# Patient Record
Sex: Male | Born: 1950 | Race: Black or African American | Hispanic: No | Smoking: Current every day smoker
Health system: Southern US, Community
[De-identification: ages and names within clinical notes are randomized; demographics above are authoritative.]

## PROBLEM LIST (undated history)

## (undated) DIAGNOSIS — D649 Anemia, unspecified: Secondary | ICD-10-CM

## (undated) DIAGNOSIS — I10 Essential (primary) hypertension: Secondary | ICD-10-CM

## (undated) DIAGNOSIS — K219 Gastro-esophageal reflux disease without esophagitis: Secondary | ICD-10-CM

## (undated) DIAGNOSIS — K852 Alcohol induced acute pancreatitis without necrosis or infection: Secondary | ICD-10-CM

## (undated) DIAGNOSIS — E7800 Pure hypercholesterolemia, unspecified: Secondary | ICD-10-CM

## (undated) DIAGNOSIS — Z720 Tobacco use: Secondary | ICD-10-CM

## (undated) HISTORY — PX: DENTAL SURGERY: SHX609

## (undated) HISTORY — DX: Anemia, unspecified: D64.9

---

## 2011-08-24 ENCOUNTER — Ambulatory Visit: Payer: Self-pay

## 2011-08-24 DIAGNOSIS — I10 Essential (primary) hypertension: Secondary | ICD-10-CM

## 2011-08-24 DIAGNOSIS — F17200 Nicotine dependence, unspecified, uncomplicated: Secondary | ICD-10-CM

## 2012-01-25 ENCOUNTER — Telehealth: Payer: Self-pay

## 2012-01-25 NOTE — Telephone Encounter (Signed)
Patient last seen January 2012--needs OV.  Called patient and let him know--he will try to see [facility].

## 2012-01-25 NOTE — Telephone Encounter (Signed)
The patient called to obtain refill of his Norvasc 5 mg Rx.  The patient stated that the [facility] on [address] told him he needed to call his doctor.  Please call patient at [phone].

## 2012-04-19 ENCOUNTER — Ambulatory Visit: Payer: Self-pay | Admitting: Family Medicine

## 2012-04-19 VITALS — BP 130/82 | HR 86 | Temp 98.1°F | Resp 16 | Ht 68.0 in | Wt 149.0 lb

## 2012-04-19 DIAGNOSIS — E785 Hyperlipidemia, unspecified: Secondary | ICD-10-CM

## 2012-04-19 DIAGNOSIS — I10 Essential (primary) hypertension: Secondary | ICD-10-CM

## 2012-04-19 DIAGNOSIS — E782 Mixed hyperlipidemia: Secondary | ICD-10-CM

## 2012-04-19 MED ORDER — AMLODIPINE BESYLATE 5 MG PO TABS: 5.0000 mg | ORAL_TABLET | Freq: Every day | ORAL | Status: DC

## 2012-04-19 MED ORDER — LOVASTATIN 20 MG PO TABS: 20.0000 mg | ORAL_TABLET | Freq: Every day | ORAL | Status: DC

## 2012-04-19 NOTE — Progress Notes (Signed)
  Subjective:    Patient ID: Nicholas Blevins, male    DOB: 02-Jan-1951, 61 y.o.   MRN: [id number]  HPI Nicholas Blevins is a 61 y.o. male  Hx of HTN - prior on Norvasc 5mg  QD  Last taken 4 months ago.  Had been out of work - unable to come in.  Had job physical in [employer] - told pressure was high then - diastolic 97?  Not sure.  No recent outside blood sugars. No headaches/cp or other new symptoms.   Seen here Jan 2013 - BP 168/92.  6 months meds given.    Had hyperlipidemia - started on Lipitor in January for LDL 175 - ran out few months ago.     Review of Systems  Constitutional: Negative for fatigue and unexpected weight change.  Eyes: Negative for visual disturbance.  Respiratory: Negative for cough, chest tightness and shortness of breath.   Cardiovascular: Negative for chest pain, palpitations and leg swelling.  Gastrointestinal: Negative for abdominal pain and blood in stool.  Genitourinary: Negative for decreased urine volume and difficulty urinating.  Neurological: Negative for dizziness, light-headedness and headaches.       Objective:   Physical Exam  Vitals reviewed. Constitutional: He is oriented to person, place, and time. He appears well-developed and well-nourished.  HENT:  Head: Normocephalic and atraumatic.  Eyes: EOM are normal. Pupils are equal, round, and reactive to light.  Neck: No JVD present. Carotid bruit is not present.  Cardiovascular: Normal rate, regular rhythm and normal heart sounds.   No murmur heard. Pulmonary/Chest: Effort normal and breath sounds normal. He has no rales.  Musculoskeletal: He exhibits no edema.  Neurological: He is alert and oriented to person, place, and time.  Skin: Skin is warm and dry.  Psychiatric: He has a normal mood and affect.  recheck BP left arm - manual - 162/98.        Assessment & Plan:   Nicholas Blevins is a 61 y.o. male 1. HTN (hypertension)  Comprehensive metabolic panel, amLODipine (NORVASC) 5 MG tablet    2. Hyperlipidemia  Comprehensive metabolic panel, lovastatin (MEVACOR) 20 MG tablet   Restart norvasc at 5mg . .  Check CMP for statin restart - low dose generic to start, but will need lipid panel in few months. rtc precautions.  Patient Instructions  Keep a record of your blood pressures outside of the office and bring them to the next office visit. Restart your amlodipine for blood pressure, and lovastatin for cholesterol.  You need to recheck in the next 3 months for fasting cholesterol test before refills of the cholesterol medicine. If you would like to look into financial assistance options for your healthcare, you can call the [facility] Financial Aid Office at [phone]. Return to the clinic or go to the nearest emergency room if any of your symptoms worsen or new symptoms occur. Your should receive a call or letter about your lab results within the next week to 10 days.

## 2012-04-19 NOTE — Patient Instructions (Signed)
Keep a record of your blood pressures outside of the office and bring them to the next office visit. Restart your amlodipine for blood pressure, and lovastatin for cholesterol.  You need to recheck in the next 3 months for fasting cholesterol test before refills of the cholesterol medicine. If you would like to look into financial assistance options for your healthcare, you can call the [facility] Financial Aid Office at [phone]. Return to the clinic or go to the nearest emergency room if any of your symptoms worsen or new symptoms occur. Your should receive a call or letter about your lab results within the next week to 10 days.

## 2012-04-20 LAB — COMPREHENSIVE METABOLIC PANEL
AST: 35 U/L (ref 0–37)
Albumin: 4.7 g/dL (ref 3.5–5.2)
Alkaline Phosphatase: 72 U/L (ref 39–117)
Chloride: 101 mEq/L (ref 96–112)
Potassium: 4 mEq/L (ref 3.5–5.3)
Sodium: 138 mEq/L (ref 135–145)
Total Protein: 7.6 g/dL (ref 6.0–8.3)

## 2012-04-21 ENCOUNTER — Telehealth: Payer: Self-pay

## 2012-04-21 NOTE — Telephone Encounter (Signed)
Pt returned call about labs, please try again @ [phone]

## 2012-04-22 NOTE — Telephone Encounter (Signed)
Spoke with wife and gave her the results of his lab being normal and in range, to call us back if any questions. Will pass message to pt.

## 2012-04-22 NOTE — Telephone Encounter (Signed)
SENT TO CLERICAL IN ERROR

## 2012-12-19 ENCOUNTER — Ambulatory Visit (INDEPENDENT_AMBULATORY_CARE_PROVIDER_SITE_OTHER): Payer: Commercial Indemnity | Admitting: Family Medicine

## 2012-12-19 ENCOUNTER — Ambulatory Visit: Payer: Commercial Indemnity

## 2012-12-19 VITALS — BP 140/60 | HR 73 | Temp 98.2°F | Resp 16 | Ht 68.5 in | Wt 150.0 lb

## 2012-12-19 DIAGNOSIS — E7800 Pure hypercholesterolemia, unspecified: Secondary | ICD-10-CM | POA: Insufficient documentation

## 2012-12-19 DIAGNOSIS — M25562 Pain in left knee: Secondary | ICD-10-CM

## 2012-12-19 DIAGNOSIS — I10 Essential (primary) hypertension: Secondary | ICD-10-CM

## 2012-12-19 DIAGNOSIS — M25569 Pain in unspecified knee: Secondary | ICD-10-CM

## 2012-12-19 MED ORDER — TRAMADOL HCL 50 MG PO TABS: 50.0000 mg | ORAL_TABLET | Freq: Three times a day (TID) | ORAL | Status: DC | PRN

## 2012-12-19 MED ORDER — MELOXICAM 7.5 MG PO TABS: 7.5000 mg | ORAL_TABLET | Freq: Every day | ORAL | Status: DC

## 2012-12-19 NOTE — Progress Notes (Signed)
[facility] [address], [place] [address] [id number]  Date:  12/19/2012   Name:  Nicholas Blevins   DOB:  Jun 22, 1951   MRN:  [id number]  PCP:  [name], MD    Chief Complaint: Knee Injury   History of Present Illness:  Nicholas Blevins is a 62 y.o. very pleasant male patient who presents with the following:  [place] the [place] and he [place]- he caught his foot on a fence and fell down onto his left knee.  He did not note any particular pain when he fell, but this morning the knee seemed swollen and stiff, but not really painful.    He has been able to walk on the knee ok, but it does feel stiff.  It feels a little unstable when he walks downhill, but it has not given away.    No prior history of knee problems.    His last tetanus shot was less than 10 years ago- he feels certain of this fact.    He is otherwise generally healthy except for HTN and high cholesterol.    There are no active problems to display for this patient.   No past medical history on file.  No past surgical history on file.  History  Substance Use Topics  . Smoking status: Current Every Day Smoker  . Smokeless tobacco: Not on file  . Alcohol Use: Not on file    No family history on file.  No Known Allergies  Medication list has been reviewed and updated.  Current Outpatient Prescriptions on File Prior to Visit  Medication Sig Dispense Refill  . amLODipine (NORVASC) 5 MG tablet Take 1 tablet (5 mg total) by mouth daily.  90 tablet  1  . lovastatin (MEVACOR) 20 MG tablet Take 1 tablet (20 mg total) by mouth at bedtime.  90 tablet  0   No current facility-administered medications on file prior to visit.    Review of Systems:  As per HPI- otherwise negative.   Physical Examination: Filed Vitals:   12/19/12 1110  BP: 140/60  Pulse: 73  Temp: 98.2 F (36.8 C)  Resp: 16   Filed Vitals:   12/19/12 1110  Height: 5' 8.5" (1.74 m)   Weight: 150 lb (68.04 kg)   Body mass index is 22.47 kg/(m^2). Ideal Body Weight: Weight in (lb) to have BMI = 25: 166.5  GEN: WDWN, NAD, Non-toxic, A & O x 3, thin build HEENT: Atraumatic, Normocephalic. Neck supple. No masses, No LAD. Ears and Nose: No external deformity. CV: RRR, No M/G/R. No JVD. No thrill. No extra heart sounds. PULM: CTA B, no wheezes, crackles, rhonchi. No retractions. No resp. distress. No accessory muscle use. EXTR: No c/c/e NEURO Normal gait.  PSYCH: Normally interactive. Conversant. Not depressed or anxious appearing.  Calm demeanor.  Left knee:  Normal flexion and extension.  Negative varus and valgus stress.  No heat or redness in the knee.  He has slight laxity to anterior drawer but this is equal bilaterally.  Small joint effusion.    [facility] reading (PRIMARY) by  Dr. Patsy Lager. Left knee: negative  *RADIOLOGY REPORT*  Clinical Data: Left knee pain  LEFT KNEE - COMPLETE 4+ VIEW  Comparison: None.  Findings: Four views of the left knee submitted. No acute fracture or subluxation. No joint effusion.  IMPRESSION: No acute fracture or subluxation.  Assessment and Plan: Knee pain, acute, left - Plan: DG Knee  Complete 4 Views Left, meloxicam (MOBIC) 7.5 MG tablet, traMADol (ULTRAM) 50 MG tablet  Knee pain after a fall.  Suspect he may have a meniscal tear, but at this time he would like to see how he does over the next several days.  Given a hinged knee brace.  mobic and tramadol to use as needed.    He will call if not better soon  Signed Abbe Amsterdam, MD

## 2012-12-19 NOTE — Patient Instructions (Addendum)
You can use tylenol as needed for pain and swelling.  The mobic is also for for pain and swelling, but could increase your blood pressure.  If you use the mobic keep an eye on your blood pressure and let us know if if goes up.    The tramadol is for pain as well- it can make you feel drowsy, so do not use it when you need to drive.    Ice and elevate your knee, and use the knee brace (or an ace wrap) as needed.  If your knee is not better in the next few days please give me a call

## 2012-12-20 ENCOUNTER — Telehealth: Payer: Self-pay

## 2012-12-20 NOTE — Telephone Encounter (Signed)
Nicholas Blevins [facility] STATES THEY NEED TO KNOW RESTRICTIONS GIVEN TO PT, IF ON LIGHT DUTY OR WHAT. THIS IS NOT A W/C PLEASE CALL [phone] AND THE FAX IS [phone]

## 2012-12-20 NOTE — Telephone Encounter (Signed)
Does patient have work restrictions? Please advise, I can not call nurse but I can call patient.

## 2012-12-20 NOTE — Telephone Encounter (Signed)
Thanks Amy- I am not familiar with the exact nature of his job.  I would say he can walk short distances as tolerated, and should have a lifting restriction of 20 lbs.

## 2012-12-21 NOTE — Telephone Encounter (Signed)
Needs to speak to patient regarding this. Can not speak to nurse. Left message for patient to call me back.

## 2012-12-24 NOTE — Telephone Encounter (Signed)
Patient has not called back. Left another message for him if he needs anything he is to call me back. I advised him on the recording I can only speak to him not the nurse

## 2013-02-23 ENCOUNTER — Other Ambulatory Visit: Payer: Self-pay | Admitting: Family Medicine

## 2015-06-01 ENCOUNTER — Other Ambulatory Visit (HOSPITAL_COMMUNITY): Payer: Self-pay | Admitting: Internal Medicine

## 2015-06-01 ENCOUNTER — Ambulatory Visit (HOSPITAL_COMMUNITY)
Admission: RE | Admit: 2015-06-01 | Discharge: 2015-06-01 | Disposition: A | Discharge status: Home / Self Care | Payer: Managed Care, Other (non HMO) | Source: Ambulatory Visit | Attending: Internal Medicine | Admitting: Internal Medicine

## 2015-06-01 DIAGNOSIS — R0602 Shortness of breath: Secondary | ICD-10-CM

## 2015-06-01 DIAGNOSIS — F17200 Nicotine dependence, unspecified, uncomplicated: Secondary | ICD-10-CM | POA: Diagnosis not present

## 2015-06-01 DIAGNOSIS — I10 Essential (primary) hypertension: Secondary | ICD-10-CM | POA: Insufficient documentation

## 2015-06-01 DIAGNOSIS — R05 Cough: Secondary | ICD-10-CM | POA: Insufficient documentation

## 2016-02-24 ENCOUNTER — Inpatient Hospital Stay (HOSPITAL_COMMUNITY)
Admission: EM | Admit: 2016-02-24 | Discharge: 2016-02-28 | DRG: 439 | Disposition: A | Discharge status: Home / Self Care | Payer: Managed Care, Other (non HMO) | Attending: Internal Medicine | Admitting: Internal Medicine

## 2016-02-24 ENCOUNTER — Encounter (HOSPITAL_COMMUNITY): Payer: Self-pay

## 2016-02-24 ENCOUNTER — Emergency Department (HOSPITAL_COMMUNITY): Payer: Managed Care, Other (non HMO)

## 2016-02-24 DIAGNOSIS — E7800 Pure hypercholesterolemia, unspecified: Secondary | ICD-10-CM | POA: Diagnosis present

## 2016-02-24 DIAGNOSIS — K852 Alcohol induced acute pancreatitis without necrosis or infection: Secondary | ICD-10-CM | POA: Diagnosis not present

## 2016-02-24 DIAGNOSIS — K858 Other acute pancreatitis without necrosis or infection: Secondary | ICD-10-CM

## 2016-02-24 DIAGNOSIS — I10 Essential (primary) hypertension: Secondary | ICD-10-CM | POA: Diagnosis not present

## 2016-02-24 DIAGNOSIS — E785 Hyperlipidemia, unspecified: Secondary | ICD-10-CM | POA: Diagnosis present

## 2016-02-24 DIAGNOSIS — Z79899 Other long term (current) drug therapy: Secondary | ICD-10-CM

## 2016-02-24 DIAGNOSIS — F10230 Alcohol dependence with withdrawal, uncomplicated: Secondary | ICD-10-CM | POA: Diagnosis present

## 2016-02-24 DIAGNOSIS — R509 Fever, unspecified: Secondary | ICD-10-CM

## 2016-02-24 DIAGNOSIS — F17200 Nicotine dependence, unspecified, uncomplicated: Secondary | ICD-10-CM | POA: Diagnosis present

## 2016-02-24 DIAGNOSIS — R1013 Epigastric pain: Secondary | ICD-10-CM | POA: Diagnosis not present

## 2016-02-24 DIAGNOSIS — Z23 Encounter for immunization: Secondary | ICD-10-CM

## 2016-02-24 HISTORY — DX: Essential (primary) hypertension: I10

## 2016-02-24 HISTORY — DX: Pure hypercholesterolemia, unspecified: E78.00

## 2016-02-24 LAB — URINALYSIS, ROUTINE W REFLEX MICROSCOPIC
GLUCOSE, UA: NEGATIVE mg/dL
Hgb urine dipstick: NEGATIVE
Ketones, ur: 15 mg/dL — AB
LEUKOCYTES UA: NEGATIVE
Nitrite: NEGATIVE
Protein, ur: 30 mg/dL — AB
SPECIFIC GRAVITY, URINE: 1.025 (ref 1.005–1.030)
pH: 6.5 (ref 5.0–8.0)

## 2016-02-24 LAB — CBC
HEMATOCRIT: 42.3 % (ref 39.0–52.0)
Hemoglobin: 15.2 g/dL (ref 13.0–17.0)
MCH: 36.4 pg — ABNORMAL HIGH (ref 26.0–34.0)
MCHC: 35.9 g/dL (ref 30.0–36.0)
MCV: 101.2 fL — AB (ref 78.0–100.0)
Platelets: 189 10*3/uL (ref 150–400)
RBC: 4.18 MIL/uL — AB (ref 4.22–5.81)
RDW: 13.5 % (ref 11.5–15.5)
WBC: 8 10*3/uL (ref 4.0–10.5)

## 2016-02-24 LAB — URINE MICROSCOPIC-ADD ON
BACTERIA UA: NONE SEEN
Squamous Epithelial / LPF: NONE SEEN
WBC, UA: NONE SEEN WBC/hpf (ref 0–5)

## 2016-02-24 LAB — COMPREHENSIVE METABOLIC PANEL
ALT: 38 U/L (ref 17–63)
ANION GAP: 13 (ref 5–15)
AST: 46 U/L — ABNORMAL HIGH (ref 15–41)
Albumin: 4.7 g/dL (ref 3.5–5.0)
Alkaline Phosphatase: 74 U/L (ref 38–126)
BILIRUBIN TOTAL: 0.6 mg/dL (ref 0.3–1.2)
BUN: 10 mg/dL (ref 6–20)
CHLORIDE: 97 mmol/L — AB (ref 101–111)
CO2: 29 mmol/L (ref 22–32)
Calcium: 10.3 mg/dL (ref 8.9–10.3)
Creatinine, Ser: 0.88 mg/dL (ref 0.61–1.24)
Glucose, Bld: 120 mg/dL — ABNORMAL HIGH (ref 65–99)
POTASSIUM: 3.7 mmol/L (ref 3.5–5.1)
Sodium: 139 mmol/L (ref 135–145)
Total Protein: 8.2 g/dL — ABNORMAL HIGH (ref 6.5–8.1)

## 2016-02-24 LAB — LIPASE, BLOOD: LIPASE: 939 U/L — AB (ref 11–51)

## 2016-02-24 MED ORDER — MELOXICAM 7.5 MG PO TABS
7.5000 mg | ORAL_TABLET | Freq: Every day | ORAL | Status: DC
  Administered 2016-02-24 – 2016-02-28 (×5): 7.5 mg via ORAL
  Filled 2016-02-24 (×5): qty 1

## 2016-02-24 MED ORDER — SODIUM CHLORIDE 0.9 % IV SOLN
INTRAVENOUS | Status: DC
  Administered 2016-02-24: 23:00:00 via INTRAVENOUS

## 2016-02-24 MED ORDER — SODIUM CHLORIDE 0.9 % IV SOLN
INTRAVENOUS | Status: DC
  Administered 2016-02-24 – 2016-02-26 (×5): via INTRAVENOUS

## 2016-02-24 MED ORDER — ONDANSETRON HCL 4 MG/2ML IJ SOLN: 4.0000 mg | Freq: Four times a day (QID) | INTRAMUSCULAR | Status: DC | PRN

## 2016-02-24 MED ORDER — ENOXAPARIN SODIUM 40 MG/0.4ML ~~LOC~~ SOLN
40.0000 mg | Freq: Every day | SUBCUTANEOUS | Status: DC
  Administered 2016-02-24 – 2016-02-26 (×3): 40 mg via SUBCUTANEOUS
  Filled 2016-02-24 (×4): qty 0.4

## 2016-02-24 MED ORDER — MORPHINE SULFATE (PF) 4 MG/ML IV SOLN
4.0000 mg | Freq: Once | INTRAVENOUS | Status: AC
  Administered 2016-02-24: 4 mg via INTRAVENOUS
  Filled 2016-02-24: qty 1

## 2016-02-24 MED ORDER — AMLODIPINE BESYLATE 5 MG PO TABS
5.0000 mg | ORAL_TABLET | Freq: Every day | ORAL | Status: DC
  Administered 2016-02-24 – 2016-02-26 (×3): 5 mg via ORAL
  Filled 2016-02-24 (×3): qty 1

## 2016-02-24 MED ORDER — SODIUM CHLORIDE 0.9 % IV SOLN: INTRAVENOUS | Status: AC

## 2016-02-24 MED ORDER — ATENOLOL 25 MG PO TABS
25.0000 mg | ORAL_TABLET | Freq: Every day | ORAL | Status: DC
  Administered 2016-02-25 – 2016-02-28 (×4): 25 mg via ORAL
  Filled 2016-02-24 (×5): qty 1

## 2016-02-24 MED ORDER — PNEUMOCOCCAL VAC POLYVALENT 25 MCG/0.5ML IJ INJ
0.5000 mL | INJECTION | INTRAMUSCULAR | Status: AC
  Administered 2016-02-25: 0.5 mL via INTRAMUSCULAR
  Filled 2016-02-24: qty 0.5

## 2016-02-24 MED ORDER — ONDANSETRON HCL 4 MG/2ML IJ SOLN
4.0000 mg | Freq: Once | INTRAMUSCULAR | Status: AC
  Administered 2016-02-24: 4 mg via INTRAVENOUS
  Filled 2016-02-24: qty 2

## 2016-02-24 MED ORDER — MORPHINE SULFATE (PF) 2 MG/ML IV SOLN
2.0000 mg | INTRAVENOUS | Status: DC | PRN
  Administered 2016-02-24 – 2016-02-25 (×2): 2 mg via INTRAVENOUS
  Filled 2016-02-24 (×2): qty 1

## 2016-02-24 MED ORDER — PRAVASTATIN SODIUM 20 MG PO TABS
20.0000 mg | ORAL_TABLET | Freq: Every day | ORAL | Status: DC
  Administered 2016-02-25 – 2016-02-27 (×3): 20 mg via ORAL
  Filled 2016-02-24 (×4): qty 1

## 2016-02-24 MED ORDER — IOPAMIDOL (ISOVUE-300) INJECTION 61%
INTRAVENOUS | Status: AC
  Administered 2016-02-24: 20:00:00
  Administered 2016-02-24: 100 mL
  Filled 2016-02-24: qty 100

## 2016-02-24 NOTE — ED Notes (Signed)
Admitting at bedside 

## 2016-02-24 NOTE — ED Triage Notes (Signed)
Patient here with 2 days of sharp, cramping abdominal pain with nausea, no diarrhea, last BM today was this am. Patient states that he was sent from an urgent care for further evaluation

## 2016-02-24 NOTE — ED Notes (Signed)
Pt to CT

## 2016-02-24 NOTE — ED Notes (Signed)
Pt back from CT

## 2016-02-24 NOTE — ED Provider Notes (Signed)
[facility]EMER[facility]ENCY DEPT Provider Note   CSN: [id number] Arrival date & time: 02/24/16  1516  First Provider Contact:  None       History   Chief Complaint Chief Complaint  Patient presents with  . Abdominal Pain    HPI Nicholas Blevins is a 65 y.o. male.  HPI Patient has had approximate 2 days of severe epigastric pain. He reports it has been gradual in onset. Pain does radiate somewhat into his back. He reports that today after work he felt very nauseated and had some dry heaves but did not actually bring up much vomit. He's had no appetite today. No documented fever. No diarrhea or constipation. No pain burning or urgency urination. Patient has no history of pancreatitis. Patient frequently drinks a few beers a day after work. He does report that he was on vacation this weekend and he drank more than usual. Past Medical History:  Diagnosis Date  . High cholesterol   . Hypertension     Patient Active Problem List   Diagnosis Date Noted  . Acute alcoholic pancreatitis 02/24/2016  . Pancreatitis 02/24/2016  . HTN (hypertension) 12/19/2012  . High cholesterol 12/19/2012    History reviewed. No pertinent surgical history.     Home Medications    Prior to Admission medications   Medication Sig Start Date End Date Taking? Authorizing Provider  amLODipine (NORVASC) 5 MG tablet Take 1 tablet (5 mg total) by mouth daily. 04/19/12   Shade Flood, MD  atenolol (TENORMIN) 25 MG tablet Take 25 mg by mouth daily.    Historical Provider, MD  lovastatin (MEVACOR) 20 MG tablet Take 1 tablet (20 mg total) by mouth at bedtime. PATIENT NEEDS OFFICE VISIT/LABS FOR ADDITIONAL REFILLS 02/23/13   Pearline Cables, MD  meloxicam (MOBIC) 7.5 MG tablet Take 1 tablet (7.5 mg total) by mouth daily. As needed for knee pain 12/19/12   Pearline Cables, MD  traMADol (ULTRAM) 50 MG tablet Take 1 tablet (50 mg total) by mouth every 8 (eight) hours as needed for pain. 12/19/12   Pearline Cables, MD     Family History No family history on file.  Social History Social History  Substance Use Topics  . Smoking status: Current Every Day Smoker  . Smokeless tobacco: Never Used  . Alcohol use Yes     Allergies   Review of patient's allergies indicates no known allergies.   Review of Systems Review of Systems 10 Systems reviewed and are negative for acute change except as noted in the HPI.  Physical Exam Updated Vital Signs BP 178/82   Pulse 70   Temp 98.2 F (36.8 C) (Oral)   Resp 16   Ht  (1.727 m)   Wt 158 lb (71.7 kg)   SpO2 98%   BMI 24.02 kg/m   Physical Exam  Constitutional: He appears well-developed and well-nourished. No distress.  HENT:  Head: Normocephalic and atraumatic.  Eyes: EOM are normal. No scleral icterus.  Cardiovascular: Normal rate, regular rhythm, normal heart sounds and intact distal pulses.   Pulmonary/Chest: Effort normal and breath sounds normal. No respiratory distress.  Abdominal: Soft. He exhibits no distension. There is tenderness.  Severe tenderness to palpation in the epigastrium with involuntary guarding.  Musculoskeletal: Normal range of motion. He exhibits no edema or tenderness.  Neurological: He is alert. No cranial nerve deficit. He exhibits normal muscle tone. Coordination normal.  Skin: Skin is warm and dry.  Psychiatric: He has a normal mood  and affect.     ED Treatments / Results  Labs (all labs ordered are listed, but only abnormal results are displayed) Labs Reviewed  LIPASE, BLOOD - Abnormal; Notable for the following:       Result Value   Lipase 939 (*)    All other components within normal limits  COMPREHENSIVE METABOLIC PANEL - Abnormal; Notable for the following:    Chloride 97 (*)    Glucose, Bld 120 (*)    Total Protein 8.2 (*)    AST 46 (*)    All other components within normal limits  CBC - Abnormal; Notable for the following:    RBC 4.18 (*)    MCV 101.2 (*)    MCH 36.4 (*)    All other  components within normal limits  URINALYSIS, ROUTINE W REFLEX MICROSCOPIC (NOT AT [facility]) - Abnormal; Notable for the following:    Color, Urine AMBER (*)    Bilirubin Urine SMALL (*)    Ketones, ur 15 (*)    Protein, ur 30 (*)    All other components within normal limits  URINE MICROSCOPIC-ADD ON  CBC  COMPREHENSIVE METABOLIC PANEL    EKG  EKG Interpretation None       Radiology Ct Abdomen Pelvis W Contrast  Result Date: 02/24/2016 CLINICAL DATA:  Follow-up pancreatitis. EXAM: CT ABDOMEN AND PELVIS WITH CONTRAST TECHNIQUE: Multidetector CT imaging of the abdomen and pelvis was performed using the standard protocol following bolus administration of intravenous contrast. CONTRAST:  100 ISOVUE-300 IOPAMIDOL (ISOVUE-300) INJECTION 61% COMPARISON:  None. FINDINGS: Lower chest: Limited visualization of the lower thorax demonstrates minimal dependent subpleural ground-glass atelectasis, right greater than left. No focal airspace opacities. No pleural effusion. Normal heart size. Coronary artery calcifications. No pericardial effusion. Hepatobiliary: Normal hepatic contour. There is diffuse decreased attenuation of the hepatic parenchyma suggestive of hepatic steatosis. There are multiple hypo attenuating hepatic lesions with dominant adjacent lesions within in the right lobe of the liver demonstrating Hounsfield units compatible with hepatic cysts. Additional smaller sub cm hypoattenuating liver lesions are too small to adequately characterize of favored to represent additional hepatic cysts. Normal appearance of the gallbladder given underdistention. No definite radiopaque gallstones. No intra extrahepatic bili duct dilatation. No ascites. Pancreas: There is rather extensive stranding about the pancreatic and compatible with provided history of acute pancreatitis. There is homogeneous enhancement of the pancreatic parenchyma. No discrete pancreatic mass or pancreatic ductal dilatation on this non  pancreatic protocol CT scan. No definable/drainable peripancreatic fluid collection. The splenic artery and vein appear patent without evidence of vessel irregularity or contrast extravasation. Spleen: Normal appearance of the spleen. Note is made of a punctate splenule. Adrenals/Urinary Tract: There is homogeneous enhancement and excretion of the bilateral kidneys. No definite renal stones in this postcontrast examination. No discrete renal lesions. No urine obstruction or perinephric stranding. Normal appearance of the bilateral adrenal glands. Normal appearance of the urinary bladder given degree distention. Stomach/Bowel: The bowel is normal in course and caliber without wall thickening or evidence of enteric obstruction. Normal appearance of the terminal ileum and appendix. No pneumoperitoneum, pneumatosis or portal venous gas. Vascular/Lymphatic: Moderate amount of mixed calcified and noncalcified atherosclerotic plaque within a normal caliber abdominal aorta. The major Dotson vessels of the abdominal aorta appear patent on this non CTA examination. As above, both the splenic artery and vein remain widely patent without discrete area of vessel irregularity or contrast extravasation. Shotty porta hepatis lymph nodes are not enlarged by size criteria with index  porta hepatis lymph node measuring 0.9 cm in greatest short axis diameter (26, series 2), presumably reactive in etiology. No bulky retroperitoneal, mesenteric, pelvic or inguinal lymphadenopathy. Reproductive: Normal appearance of the prostate gland. A punctate phlebolith is seen within the left hemipelvis. No free fluid in the pelvic cul-de-sac. Other: Regional soft tissues appear normal. Musculoskeletal: No acute or aggressive osseous abnormalities. Mild-to-moderate degenerative change of the bilateral facets of the lower lumbar spine. IMPRESSION: 1. Findings compatible with acute uncomplicated pancreatitis. No evidence of pancreatic necrosis or  definable/drainable fluid collection/pseudocyst. 2. Suspected hepatic steatosis. Correlation with LFTs is recommended. 3. Hepatic cysts. 4.  Aortic Atherosclerosis (ICD10-170.0) Electronically Signed   By: Simonne Come M.D.   On: 02/24/2016 20:30   Procedures Procedures (including critical care time)  Medications Ordered in ED Medications  0.9 %  sodium chloride infusion ( Intravenous New Bag/Given 02/24/16 1957)  amLODipine (NORVASC) tablet 5 mg (not administered)  atenolol (TENORMIN) tablet 25 mg (not administered)  meloxicam (MOBIC) tablet 7.5 mg (not administered)  pravastatin (PRAVACHOL) tablet 20 mg (not administered)  morphine 2 MG/ML injection 2-4 mg (not administered)  0.9 %  sodium chloride infusion (not administered)  enoxaparin (LOVENOX) injection 40 mg (not administered)  morphine 4 MG/ML injection 4 mg (4 mg Intravenous Given 02/24/16 1958)  ondansetron (ZOFRAN) injection 4 mg (4 mg Intravenous Given 02/24/16 1957)  iopamidol (ISOVUE-300) 61 % injection (100 mLs  Contrast Given 02/24/16 2011)     Initial Impression / Assessment and Plan / ED Course  I have reviewed the triage vital signs and the nursing notes.  Pertinent labs & imaging results that were available during my care of the patient were reviewed by me and considered in my medical decision making (see chart for details).  Clinical Course  Patient will be admitted to hospital service for pain control and hydration. Consult: Dr. Lanae Boast for admission  Final Clinical Impressions(s) / ED Diagnoses   Final diagnoses:  Other acute pancreatitis  Patient presents with pancreatitis. He does drink typically several beers per day and more on the weekends. This is most likely alcohol related pancreatitis. He is nontoxic. Vital signs and respirations as are stable.  New Prescriptions New Prescriptions   No medications on file     Arby Barrette, MD 02/24/16 2107

## 2016-02-24 NOTE — H&P (Signed)
History and Physical    Nicholas Blevins ZOX:[id number] DOB: February 27, 1951 DOA: 02/24/2016   PCP: [name], MD Chief Complaint:  Chief Complaint  Patient presents with  . Abdominal Pain    HPI: Nicholas Blevins is a 65 y.o. male with medical history significant of HLD, HTN.  Patient presents to the ED with c/o epigastric abdominal pain and nausea.  Symptoms onset 2 days ago.  Worsening gradually.  Radiates to back.  Admits that they "over did it" with EtOH use this weekend due to family reunion.  No PMH of pancreatitis previously, no diarrhea or constipation.  Did have dry heaves but no actual vomiting.  No appetite today.  ED Course: Mild Acute pancreatitis.  Review of Systems: As per HPI otherwise 10 point review of systems negative.    Past Medical History:  Diagnosis Date  . High cholesterol   . Hypertension     History reviewed. No pertinent surgical history.   reports that he has been smoking.  He has never used smokeless tobacco. He reports that he drinks alcohol. His drug history is not on file.  No Known Allergies  No family history on file. No family history of pancreatitis.   Prior to Admission medications   Medication Sig Start Date End Date Taking? Authorizing Provider  amLODipine (NORVASC) 5 MG tablet Take 1 tablet (5 mg total) by mouth daily. 04/19/12   Shade Flood, MD  atenolol (TENORMIN) 25 MG tablet Take 25 mg by mouth daily.    Historical Provider, MD  lovastatin (MEVACOR) 20 MG tablet Take 1 tablet (20 mg total) by mouth at bedtime. PATIENT NEEDS OFFICE VISIT/LABS FOR ADDITIONAL REFILLS 02/23/13   Pearline Cables, MD  meloxicam (MOBIC) 7.5 MG tablet Take 1 tablet (7.5 mg total) by mouth daily. As needed for knee pain 12/19/12   Pearline Cables, MD  traMADol (ULTRAM) 50 MG tablet Take 1 tablet (50 mg total) by mouth every 8 (eight) hours as needed for pain. 12/19/12   Pearline Cables, MD    Physical Exam: Vitals:   02/24/16 1535 02/24/16 1754  02/24/16 1930 02/24/16 2025  BP: 145/84 155/73 175/85 178/82  Pulse: 65 (!) 55 (!) 57 70  Resp: 18 16    Temp: 97.4 F (36.3 C) 98.2 F (36.8 C)    TempSrc: Oral Oral    SpO2: 99% 100% 98% 98%  Weight: 71.7 kg (158 lb)     Height:  (1.727 m)         Constitutional: NAD, calm, comfortable Eyes: PERRL, lids and conjunctivae normal ENMT: Mucous membranes are moist. Posterior pharynx clear of any exudate or lesions.Normal dentition.  Neck: normal, supple, no masses, no thyromegaly Respiratory: clear to auscultation bilaterally, no wheezing, no crackles. Normal respiratory effort. No accessory muscle use.  Cardiovascular: Regular rate and rhythm, no murmurs / rubs / gallops. No extremity edema. 2+ pedal pulses. No carotid bruits.  Abdomen: no tenderness, no masses palpated. No hepatosplenomegaly. Bowel sounds positive.  Musculoskeletal: no clubbing / cyanosis. No joint deformity upper and lower extremities. Good ROM, no contractures. Normal muscle tone.  Skin: no rashes, lesions, ulcers. No induration Neurologic: CN 2-12 grossly intact. Sensation intact, DTR normal. Strength 5/5 in all 4.  Psychiatric: Normal judgment and insight. Alert and oriented x 3. Normal mood.    Labs on Admission: I have personally reviewed following labs and imaging studies  CBC:  Recent Labs Lab 02/24/16 1545  WBC 8.0  HGB 15.2  HCT 42.3  MCV 101.2*  PLT 189   Basic Metabolic Panel:  Recent Labs Lab 02/24/16 1545  NA 139  K 3.7  CL 97*  CO2 29  GLUCOSE 120*  BUN 10  CREATININE 0.88  CALCIUM 10.3   GFR: Estimated Creatinine Clearance: 84.2 mL/min (by C-G formula based on SCr of 0.88 mg/dL). Liver Function Tests:  Recent Labs Lab 02/24/16 1545  AST 46*  ALT 38  ALKPHOS 74  BILITOT 0.6  PROT 8.2*  ALBUMIN 4.7    Recent Labs Lab 02/24/16 1545  LIPASE 939*   No results for input(s): AMMONIA in the last 168 hours. Coagulation Profile: No results for input(s): INR,  PROTIME in the last 168 hours. Cardiac Enzymes: No results for input(s): CKTOTAL, CKMB, CKMBINDEX, TROPONINI in the last 168 hours. BNP (last 3 results) No results for input(s): PROBNP in the last 8760 hours. HbA1C: No results for input(s): HGBA1C in the last 72 hours. CBG: No results for input(s): GLUCAP in the last 168 hours. Lipid Profile: No results for input(s): CHOL, HDL, LDLCALC, TRIG, CHOLHDL, LDLDIRECT in the last 72 hours. Thyroid Function Tests: No results for input(s): TSH, T4TOTAL, FREET4, T3FREE, THYROIDAB in the last 72 hours. Anemia Panel: No results for input(s): VITAMINB12, FOLATE, FERRITIN, TIBC, IRON, RETICCTPCT in the last 72 hours. Urine analysis:    Component Value Date/Time   COLORURINE AMBER (A) 02/24/2016 1922   APPEARANCEUR CLEAR 02/24/2016 1922   LABSPEC 1.025 02/24/2016 1922   PHURINE 6.5 02/24/2016 1922   GLUCOSEU NEGATIVE 02/24/2016 1922   HGBUR NEGATIVE 02/24/2016 1922   BILIRUBINUR SMALL (A) 02/24/2016 1922   KETONESUR 15 (A) 02/24/2016 1922   PROTEINUR 30 (A) 02/24/2016 1922   NITRITE NEGATIVE 02/24/2016 1922   LEUKOCYTESUR NEGATIVE 02/24/2016 1922   Sepsis Labs: @LABRCNTIP (procalcitonin:4,lacticidven:4) )No results found for this or any previous visit (from the past 240 hour(s)).   Radiological Exams on Admission: Ct Abdomen Pelvis W Contrast  Result Date: 02/24/2016 CLINICAL DATA:  Follow-up pancreatitis. EXAM: CT ABDOMEN AND PELVIS WITH CONTRAST TECHNIQUE: Multidetector CT imaging of the abdomen and pelvis was performed using the standard protocol following bolus administration of intravenous contrast. CONTRAST:  100 ISOVUE-300 IOPAMIDOL (ISOVUE-300) INJECTION 61% COMPARISON:  None. FINDINGS: Lower chest: Limited visualization of the lower thorax demonstrates minimal dependent subpleural ground-glass atelectasis, right greater than left. No focal airspace opacities. No pleural effusion. Normal heart size. Coronary artery calcifications. No  pericardial effusion. Hepatobiliary: Normal hepatic contour. There is diffuse decreased attenuation of the hepatic parenchyma suggestive of hepatic steatosis. There are multiple hypo attenuating hepatic lesions with dominant adjacent lesions within in the right lobe of the liver demonstrating Hounsfield units compatible with hepatic cysts. Additional smaller sub cm hypoattenuating liver lesions are too small to adequately characterize of favored to represent additional hepatic cysts. Normal appearance of the gallbladder given underdistention. No definite radiopaque gallstones. No intra extrahepatic bili duct dilatation. No ascites. Pancreas: There is rather extensive stranding about the pancreatic and compatible with provided history of acute pancreatitis. There is homogeneous enhancement of the pancreatic parenchyma. No discrete pancreatic mass or pancreatic ductal dilatation on this non pancreatic protocol CT scan. No definable/drainable peripancreatic fluid collection. The splenic artery and vein appear patent without evidence of vessel irregularity or contrast extravasation. Spleen: Normal appearance of the spleen. Note is made of a punctate splenule. Adrenals/Urinary Tract: There is homogeneous enhancement and excretion of the bilateral kidneys. No definite renal stones in this postcontrast examination. No discrete renal lesions. No urine obstruction  or perinephric stranding. Normal appearance of the bilateral adrenal glands. Normal appearance of the urinary bladder given degree distention. Stomach/Bowel: The bowel is normal in course and caliber without wall thickening or evidence of enteric obstruction. Normal appearance of the terminal ileum and appendix. No pneumoperitoneum, pneumatosis or portal venous gas. Vascular/Lymphatic: Moderate amount of mixed calcified and noncalcified atherosclerotic plaque within a normal caliber abdominal aorta. The major [place] vessels of the abdominal aorta appear patent on  this non CTA examination. As above, both the splenic artery and vein remain widely patent without discrete area of vessel irregularity or contrast extravasation. Shotty porta hepatis lymph nodes are not enlarged by size criteria with index porta hepatis lymph node measuring 0.9 cm in greatest short axis diameter (26, series 2), presumably reactive in etiology. No bulky retroperitoneal, mesenteric, pelvic or inguinal lymphadenopathy. Reproductive: Normal appearance of the prostate gland. A punctate phlebolith is seen within the left hemipelvis. No free fluid in the pelvic cul-de-sac. Other: Regional soft tissues appear normal. Musculoskeletal: No acute or aggressive osseous abnormalities. Mild-to-moderate degenerative change of the bilateral facets of the lower lumbar spine. IMPRESSION: 1. Findings compatible with acute uncomplicated pancreatitis. No evidence of pancreatic necrosis or definable/drainable fluid collection/pseudocyst. 2. Suspected hepatic steatosis. Correlation with LFTs is recommended. 3. Hepatic cysts. 4.  Aortic Atherosclerosis (ICD10-170.0) Electronically Signed   By: Simonne Come M.D.   On: 02/24/2016 20:30   EKG: Independently reviewed.  Assessment/Plan Principal Problem:   Acute alcoholic pancreatitis Active Problems:   HTN (hypertension)   Pancreatitis   Pancreatitis, acute alcoholic -  IVF  Clear liquid diet  Pain and nausea control  Advised against drinking EtOH at all for near future and not to "over do it" again as this is likely the cause of his pancreatitis today.  Repeat BMP and CBC in AM  HTN - continue home meds   DVT prophylaxis: Lovenox Code Status: Full Family Communication: Wife at bedside Consults called: None Admission status: Admit to obs   GARDNER, Heywood Iles DO [facility] Pager [phone] from 7PM-7AM  If 7AM-7PM, please contact the day physician for the patient [email or web address] Password [email or web address]  02/24/2016, 9:21 PM

## 2016-02-25 DIAGNOSIS — E7800 Pure hypercholesterolemia, unspecified: Secondary | ICD-10-CM | POA: Diagnosis present

## 2016-02-25 DIAGNOSIS — Z23 Encounter for immunization: Secondary | ICD-10-CM | POA: Diagnosis not present

## 2016-02-25 DIAGNOSIS — Z79899 Other long term (current) drug therapy: Secondary | ICD-10-CM | POA: Diagnosis not present

## 2016-02-25 DIAGNOSIS — K852 Alcohol induced acute pancreatitis without necrosis or infection: Secondary | ICD-10-CM | POA: Diagnosis not present

## 2016-02-25 DIAGNOSIS — F10230 Alcohol dependence with withdrawal, uncomplicated: Secondary | ICD-10-CM | POA: Diagnosis present

## 2016-02-25 DIAGNOSIS — I10 Essential (primary) hypertension: Secondary | ICD-10-CM | POA: Diagnosis not present

## 2016-02-25 DIAGNOSIS — R1013 Epigastric pain: Secondary | ICD-10-CM | POA: Diagnosis present

## 2016-02-25 DIAGNOSIS — F17200 Nicotine dependence, unspecified, uncomplicated: Secondary | ICD-10-CM | POA: Diagnosis present

## 2016-02-25 DIAGNOSIS — J189 Pneumonia, unspecified organism: Secondary | ICD-10-CM | POA: Diagnosis not present

## 2016-02-25 DIAGNOSIS — E785 Hyperlipidemia, unspecified: Secondary | ICD-10-CM | POA: Diagnosis present

## 2016-02-25 LAB — CBC
HEMATOCRIT: 38.5 % — AB (ref 39.0–52.0)
HEMOGLOBIN: 13.7 g/dL (ref 13.0–17.0)
MCH: 35.9 pg — AB (ref 26.0–34.0)
MCHC: 35.6 g/dL (ref 30.0–36.0)
MCV: 100.8 fL — AB (ref 78.0–100.0)
Platelets: 156 10*3/uL (ref 150–400)
RBC: 3.82 MIL/uL — AB (ref 4.22–5.81)
RDW: 13.7 % (ref 11.5–15.5)
WBC: 5.8 10*3/uL (ref 4.0–10.5)

## 2016-02-25 LAB — COMPREHENSIVE METABOLIC PANEL
ALK PHOS: 56 U/L (ref 38–126)
ALT: 27 U/L (ref 17–63)
ANION GAP: 9 (ref 5–15)
AST: 28 U/L (ref 15–41)
Albumin: 3.6 g/dL (ref 3.5–5.0)
BILIRUBIN TOTAL: 0.8 mg/dL (ref 0.3–1.2)
BUN: 11 mg/dL (ref 6–20)
CALCIUM: 9 mg/dL (ref 8.9–10.3)
CO2: 27 mmol/L (ref 22–32)
CREATININE: 0.8 mg/dL (ref 0.61–1.24)
Chloride: 99 mmol/L — ABNORMAL LOW (ref 101–111)
GFR calc non Af Amer: >60 mL/min (ref >60)
Glucose, Bld: 96 mg/dL (ref 65–99)
Potassium: 3.5 mmol/L (ref 3.5–5.1)
SODIUM: 135 mmol/L (ref 135–145)
TOTAL PROTEIN: 6.4 g/dL — AB (ref 6.5–8.1)

## 2016-02-25 MED ORDER — ADULT MULTIVITAMIN W/MINERALS CH
1.0000 | ORAL_TABLET | Freq: Every day | ORAL | Status: DC
  Administered 2016-02-25 – 2016-02-28 (×4): 1 via ORAL
  Filled 2016-02-25 (×4): qty 1

## 2016-02-25 MED ORDER — FOLIC ACID 1 MG PO TABS
1.0000 mg | ORAL_TABLET | Freq: Every day | ORAL | Status: DC
  Administered 2016-02-25 – 2016-02-28 (×4): 1 mg via ORAL
  Filled 2016-02-25 (×4): qty 1

## 2016-02-25 MED ORDER — LORAZEPAM 2 MG/ML IJ SOLN
2.0000 mg | INTRAMUSCULAR | Status: DC | PRN
  Administered 2016-02-25 – 2016-02-26 (×2): 2 mg via INTRAVENOUS
  Filled 2016-02-25 (×2): qty 1

## 2016-02-25 MED ORDER — VITAMIN B-1 100 MG PO TABS
100.0000 mg | ORAL_TABLET | Freq: Every day | ORAL | Status: DC
  Administered 2016-02-25 – 2016-02-28 (×4): 100 mg via ORAL
  Filled 2016-02-25 (×4): qty 1

## 2016-02-25 NOTE — Plan of Care (Signed)
Problem: Safety: Goal: Ability to remain free from injury will improve Outcome: Progressing Pt is ambulating well. Will continue to monitor the pt.

## 2016-02-25 NOTE — Progress Notes (Signed)
Patient ID: YUG JHA, male   DOB: 08-Sep-1950, 65 y.o.   MRN: [id number]    PROGRESS NOTE    Nicholas Blevins  FGB:[id number] DOB: 1950-10-15 DOA: 02/24/2016  PCP: Nicholas Due, MD   Brief Narrative:  65 y.o. male with medical history significant of HLD, HTN.  Patient presented to the ED with c/o epigastric abdominal pain and nausea.  Symptoms onset 2 days ago.  Worsening gradually. Radiates to back.  Admits that they "over did it" with EtOH use this weekend Blevins to family reunion.   Assessment & Plan:   Principal Problem:   Acute alcoholic pancreatitis - pt reports feeling better, still mild abd discomfort - continue with supportive care, IVF, antiemetics as needed - OK to advance diet   Active Problems:   HTN (hypertension), essential  - reasonable inpatient control    DVT prophylaxis: Lovenox SQ Code Status: Full  Family Communication: Patient at bedside  Disposition Plan: Home in AM if tolerating diet well   Consultants:   None   Procedures:   None  Antimicrobials:   None   Subjective: No events overnight.   Objective: Vitals:   02/24/16 2025 02/24/16 2130 02/24/16 2316 02/25/16 0448  BP: 178/82 169/87 (!) 152/80 (!) 143/78  Pulse: 70 72 73 81  Resp:   20 18  Temp:   98.8 F (37.1 C) 98.8 F (37.1 C)  TempSrc:   Oral Oral  SpO2: 98% 99% 94% 96%  Weight:      Height:        Intake/Output Summary (Last 24 hours) at 02/25/16 0941 Last data filed at 02/25/16 8022  Gross per 24 hour  Intake              220 ml  Output              450 ml  Net             -230 ml   Filed Weights   02/24/16 1535  Weight: 71.7 kg (158 lb)    Examination:  General exam: Appears calm and comfortable  Respiratory system: Clear to auscultation. Respiratory effort normal. Cardiovascular system: S1 & S2 heard, RRR. No JVD, murmurs, rubs, gallops or clicks. No pedal edema. Gastrointestinal system: Abdomen is nondistended, soft and nontender. No organomegaly or  masses felt. Normal bowel sounds heard. Central nervous system: Alert and oriented. No focal neurological deficits. Extremities: Symmetric 5 x 5 power. Skin: No rashes, lesions or ulcers Psychiatry: Judgement and insight appear normal. Mood & affect appropriate.    Data Reviewed: I have personally reviewed following labs and imaging studies  CBC:  Recent Labs Lab 02/24/16 1545 02/25/16 0457  WBC 8.0 5.8  HGB 15.2 13.7  HCT 42.3 38.5*  MCV 101.2* 100.8*  PLT 189 156   Basic Metabolic Panel:  Recent Labs Lab 02/24/16 1545 02/25/16 0457  NA 139 135  K 3.7 3.5  CL 97* 99*  CO2 29 27  GLUCOSE 120* 96  BUN 10 11  CREATININE 0.88 0.80  CALCIUM 10.3 9.0   Liver Function Tests:  Recent Labs Lab 02/24/16 1545 02/25/16 0457  AST 46* 28  ALT 38 27  ALKPHOS 74 56  BILITOT 0.6 0.8  PROT 8.2* 6.4*  ALBUMIN 4.7 3.6    Recent Labs Lab 02/24/16 1545  LIPASE 939*    Urine analysis:    Component Value Date/Time   COLORURINE AMBER (A) 02/24/2016 1922   APPEARANCEUR CLEAR 02/24/2016 1922  LABSPEC 1.025 02/24/2016 1922   PHURINE 6.5 02/24/2016 1922   GLUCOSEU NEGATIVE 02/24/2016 1922   HGBUR NEGATIVE 02/24/2016 1922   BILIRUBINUR SMALL (A) 02/24/2016 1922   KETONESUR 15 (A) 02/24/2016 1922   PROTEINUR 30 (A) 02/24/2016 1922   NITRITE NEGATIVE 02/24/2016 1922   LEUKOCYTESUR NEGATIVE 02/24/2016 1922   Radiology Studies: Ct Abdomen Pelvis W Contrast  Result Date: 02/24/2016 CLINICAL DATA:  Follow-up pancreatitis. EXAM: CT ABDOMEN AND PELVIS WITH CONTRAST TECHNIQUE: Multidetector CT imaging of the abdomen and pelvis was performed using the standard protocol following bolus administration of intravenous contrast. CONTRAST:  100 ISOVUE-300 IOPAMIDOL (ISOVUE-300) INJECTION 61% COMPARISON:  None. FINDINGS: Lower chest: Limited visualization of the lower thorax demonstrates minimal dependent subpleural ground-glass atelectasis, right greater than left. No focal airspace  opacities. No pleural effusion. Normal heart size. Coronary artery calcifications. No pericardial effusion. Hepatobiliary: Normal hepatic contour. There is diffuse decreased attenuation of the hepatic parenchyma suggestive of hepatic steatosis. There are multiple hypo attenuating hepatic lesions with dominant adjacent lesions within in the right lobe of the liver demonstrating Hounsfield units compatible with hepatic cysts. Additional smaller sub cm hypoattenuating liver lesions are too small to adequately characterize of favored to represent additional hepatic cysts. Normal appearance of the gallbladder given underdistention. No definite radiopaque gallstones. No intra extrahepatic bili duct dilatation. No ascites. Pancreas: There is rather extensive stranding about the pancreatic and compatible with provided history of acute pancreatitis. There is homogeneous enhancement of the pancreatic parenchyma. No discrete pancreatic mass or pancreatic ductal dilatation on this non pancreatic protocol CT scan. No definable/drainable peripancreatic fluid collection. The splenic artery and vein appear patent without evidence of vessel irregularity or contrast extravasation. Spleen: Normal appearance of the spleen. Note is made of a punctate splenule. Adrenals/Urinary Tract: There is homogeneous enhancement and excretion of the bilateral kidneys. No definite renal stones in this postcontrast examination. No discrete renal lesions. No urine obstruction or perinephric stranding. Normal appearance of the bilateral adrenal glands. Normal appearance of the urinary bladder given degree distention. Stomach/Bowel: The bowel is normal in course and caliber without wall thickening or evidence of enteric obstruction. Normal appearance of the terminal ileum and appendix. No pneumoperitoneum, pneumatosis or portal venous gas. Vascular/Lymphatic: Moderate amount of mixed calcified and noncalcified atherosclerotic plaque within a normal  caliber abdominal aorta. The major [place] vessels of the abdominal aorta appear patent on this non CTA examination. As above, both the splenic artery and vein remain widely patent without discrete area of vessel irregularity or contrast extravasation. Shotty porta hepatis lymph nodes are not enlarged by size criteria with index porta hepatis lymph node measuring 0.9 cm in greatest short axis diameter (26, series 2), presumably reactive in etiology. No bulky retroperitoneal, mesenteric, pelvic or inguinal lymphadenopathy. Reproductive: Normal appearance of the prostate gland. A punctate phlebolith is seen within the left hemipelvis. No free fluid in the pelvic cul-de-sac. Other: Regional soft tissues appear normal. Musculoskeletal: No acute or aggressive osseous abnormalities. Mild-to-moderate degenerative change of the bilateral facets of the lower lumbar spine. IMPRESSION: 1. Findings compatible with acute uncomplicated pancreatitis. No evidence of pancreatic necrosis or definable/drainable fluid collection/pseudocyst. 2. Suspected hepatic steatosis. Correlation with LFTs is recommended. 3. Hepatic cysts. 4.  Aortic Atherosclerosis (ICD10-170.0) Electronically Signed   By: Simonne Come M.D.   On: 02/24/2016 20:30     Scheduled Meds: . sodium chloride   Intravenous STAT  . amLODipine  5 mg Oral Daily  . atenolol  25 mg Oral Daily  .  enoxaparin (LOVENOX) injection  40 mg Subcutaneous QHS  . meloxicam  7.5 mg Oral Daily  . pneumococcal 23 valent vaccine  0.5 mL Intramuscular Tomorrow-1000  . pravastatin  20 mg Oral q1800   Continuous Infusions: . sodium chloride 125 mL/hr at 02/25/16 0559     LOS: 0 days    Time spent: 20 minutes    Debbora Presto, MD [facility] Pager [phone]  If 7PM-7AM, please contact night-coverage [email or web address] Password [facility] 02/25/2016, 9:41 AM

## 2016-02-25 NOTE — Plan of Care (Signed)
Problem: Health Behavior: Goal: Ability to formulate a plan to maintain an alcohol-free life will improve Pt was educated on the importance of becoming alcohol free. Pt stated understanding

## 2016-02-25 NOTE — Plan of Care (Signed)
Problem: Safety: Goal: Ability to remain free from injury will improve Outcome: Progressing Pt has remained free from injury this shift.   

## 2016-02-26 ENCOUNTER — Inpatient Hospital Stay (HOSPITAL_COMMUNITY): Payer: Managed Care, Other (non HMO)

## 2016-02-26 DIAGNOSIS — I10 Essential (primary) hypertension: Secondary | ICD-10-CM

## 2016-02-26 LAB — COMPREHENSIVE METABOLIC PANEL
ALT: 20 U/L (ref 17–63)
AST: 23 U/L (ref 15–41)
Albumin: 3.2 g/dL — ABNORMAL LOW (ref 3.5–5.0)
Alkaline Phosphatase: 48 U/L (ref 38–126)
Anion gap: 9 (ref 5–15)
BUN: 6 mg/dL (ref 6–20)
CHLORIDE: 102 mmol/L (ref 101–111)
CO2: 25 mmol/L (ref 22–32)
CREATININE: 0.73 mg/dL (ref 0.61–1.24)
Calcium: 8.4 mg/dL — ABNORMAL LOW (ref 8.9–10.3)
GFR calc non Af Amer: >60 mL/min (ref >60)
Glucose, Bld: 78 mg/dL (ref 65–99)
POTASSIUM: 3.3 mmol/L — AB (ref 3.5–5.1)
SODIUM: 136 mmol/L (ref 135–145)
Total Bilirubin: 0.9 mg/dL (ref 0.3–1.2)
Total Protein: 5.8 g/dL — ABNORMAL LOW (ref 6.5–8.1)

## 2016-02-26 LAB — CBC
HEMATOCRIT: 34 % — AB (ref 39.0–52.0)
HEMOGLOBIN: 12 g/dL — AB (ref 13.0–17.0)
MCH: 35.5 pg — ABNORMAL HIGH (ref 26.0–34.0)
MCHC: 35.3 g/dL (ref 30.0–36.0)
MCV: 100.6 fL — AB (ref 78.0–100.0)
Platelets: 131 10*3/uL — ABNORMAL LOW (ref 150–400)
RBC: 3.38 MIL/uL — ABNORMAL LOW (ref 4.22–5.81)
RDW: 13.7 % (ref 11.5–15.5)
WBC: 6.9 10*3/uL (ref 4.0–10.5)

## 2016-02-26 LAB — LIPASE, BLOOD: Lipase: 177 U/L — ABNORMAL HIGH (ref 11–51)

## 2016-02-26 MED ORDER — ACETAMINOPHEN 325 MG PO TABS
650.0000 mg | ORAL_TABLET | Freq: Four times a day (QID) | ORAL | Status: DC | PRN
  Administered 2016-02-26 (×2): 650 mg via ORAL
  Filled 2016-02-26 (×2): qty 2

## 2016-02-26 MED ORDER — HYDRALAZINE HCL 20 MG/ML IJ SOLN: 10.0000 mg | Freq: Four times a day (QID) | INTRAMUSCULAR | Status: DC | PRN

## 2016-02-26 MED ORDER — LEVOFLOXACIN IN D5W 750 MG/150ML IV SOLN
750.0000 mg | INTRAVENOUS | Status: DC
  Administered 2016-02-26 – 2016-02-27 (×2): 750 mg via INTRAVENOUS
  Filled 2016-02-26 (×2): qty 150

## 2016-02-26 MED ORDER — IBUPROFEN 400 MG PO TABS: 400.0000 mg | ORAL_TABLET | Freq: Four times a day (QID) | ORAL | Status: DC | PRN

## 2016-02-26 MED ORDER — OXYCODONE HCL 5 MG PO TABS
5.0000 mg | ORAL_TABLET | Freq: Four times a day (QID) | ORAL | Status: DC | PRN
  Administered 2016-02-27: 5 mg via ORAL
  Filled 2016-02-26: qty 1

## 2016-02-26 MED ORDER — POTASSIUM CHLORIDE IN NACL 20-0.9 MEQ/L-% IV SOLN
INTRAVENOUS | Status: DC
  Administered 2016-02-26 – 2016-02-27 (×3): via INTRAVENOUS
  Filled 2016-02-26 (×3): qty 1000

## 2016-02-26 NOTE — Progress Notes (Signed)
Pt with mild temperature of 100.2 this am. No orders for tylenol. Page to Donnamarie Poag for notification. Dondra Spry, RN

## 2016-02-26 NOTE — Progress Notes (Signed)
[facility] PROGRESS NOTE  CHRISTIANO BLANDON ZOX:[id number] DOB: 1950-08-15 DOA: 02/24/2016 PCP: [name], MD  Assessment/Plan: 65 y/o male with PMH of HTN, HPL, Alcoholism is admitted with acute pancreatitis.   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 CT abd: uncomplicated pancreatitis. No evidence of pancreatic necrosis or definable/drainable fluid collection/pseudocyst.  -lipase is trending down,  pt reports feeling better, cont supportive care, IVF, antiemetics as needed, diet as tolerated   Fever. Unclear etiology. Probable related to acute pancreatitis. No significant leukocytosis. Will obtain CXR  Alcoholism. Mild withdrawals. Cont monitor on CIWA.  HTN (hypertension), essential reasonable inpatient control     Code Status: full Family Communication: d/w patient, her friend  (indicate person spoken with, relationship, and if by phone, the number) Disposition Plan: home when medically ready 1-2 days    Consultants:  none  Procedures:  none  Antibiotics:  none (indicate start date, and stop date if known)  HPI/Subjective: Alert, no distress   Objective: Vitals:   02/26/16 0900 02/26/16 1005  BP: (!) 163/82 (!) 163/92  Pulse: 92 92  Resp:    Temp:      Intake/Output Summary (Last 24 hours) at 02/26/16 1041 Last data filed at 02/26/16 0930  Gross per 24 hour  Intake          5736.25 ml  Output             1625 ml  Net          4111.25 ml   Filed Weights   02/24/16 1535 02/25/16 2004  Weight: 71.7 kg (158 lb) 71.9 kg (158 lb 8 oz)    Exam:   General:  Comfortable   Cardiovascular: s1,s2 rrr  Respiratory: few crackles LL  Abdomen: soft, mild epigastric tender   Musculoskeletal: no edema    Data Reviewed: Basic Metabolic Panel:  Recent Labs Lab 02/24/16 1545 02/25/16 0457 02/26/16 0522  NA 139 135 136  K 3.7 3.5 3.3*  CL 97* 99* 102  CO2 GLUCOSE 120* 96 78  BUN CREATININE 0.88  0.80 0.73  CALCIUM 10.3 9.0 8.4*   Liver Function Tests:  Recent Labs Lab 02/24/16 1545 02/25/16 0457 02/26/16 0522  AST 46* 28 23  ALT 38 27 20  ALKPHOS 74 56 48  BILITOT 0.6 0.8 0.9  PROT 8.2* 6.4* 5.8*  ALBUMIN 4.7 3.6 3.2*    Recent Labs Lab 02/24/16 1545 02/26/16 0522  LIPASE 939* 177*   No results for input(s): AMMONIA in the last 168 hours. CBC:  Recent Labs Lab 02/24/16 1545 02/25/16 0457 02/26/16 0522  WBC 8.0 5.8 6.9  HGB 15.2 13.7 12.0*  HCT 42.3 38.5* 34.0*  MCV 101.2* 100.8* 100.6*  PLT 189 156 131*   Cardiac Enzymes: No results for input(s): CKTOTAL, CKMB, CKMBINDEX, TROPONINI in the last 168 hours. BNP (last 3 results) No results for input(s): BNP in the last 8760 hours.  ProBNP (last 3 results) No results for input(s): PROBNP in the last 8760 hours.  CBG: No results for input(s): GLUCAP in the last 168 hours.  No results found for this or any previous visit (from the past 240 hour(s)).   Studies: Ct Abdomen Pelvis W Contrast  Result Date: 02/24/2016 CLINICAL DATA:  Follow-up pancreatitis. EXAM: CT ABDOMEN AND PELVIS WITH CONTRAST TECHNIQUE: Multidetector CT imaging of the abdomen and pelvis was performed using the standard protocol following bolus administration of intravenous contrast. CONTRAST:  100 ISOVUE-300 IOPAMIDOL (ISOVUE-300)  INJECTION 61% COMPARISON:  None. FINDINGS: Lower chest: Limited visualization of the lower thorax demonstrates minimal dependent subpleural ground-glass atelectasis, right greater than left. No focal airspace opacities. No pleural effusion. Normal heart size. Coronary artery calcifications. No pericardial effusion. Hepatobiliary: Normal hepatic contour. There is diffuse decreased attenuation of the hepatic parenchyma suggestive of hepatic steatosis. There are multiple hypo attenuating hepatic lesions with dominant adjacent lesions within in the right lobe of the liver demonstrating Hounsfield units compatible with  hepatic cysts. Additional smaller sub cm hypoattenuating liver lesions are too small to adequately characterize of favored to represent additional hepatic cysts. Normal appearance of the gallbladder given underdistention. No definite radiopaque gallstones. No intra extrahepatic bili duct dilatation. No ascites. Pancreas: There is rather extensive stranding about the pancreatic and compatible with provided history of acute pancreatitis. There is homogeneous enhancement of the pancreatic parenchyma. No discrete pancreatic mass or pancreatic ductal dilatation on this non pancreatic protocol CT scan. No definable/drainable peripancreatic fluid collection. The splenic artery and vein appear patent without evidence of vessel irregularity or contrast extravasation. Spleen: Normal appearance of the spleen. Note is made of a punctate splenule. Adrenals/Urinary Tract: There is homogeneous enhancement and excretion of the bilateral kidneys. No definite renal stones in this postcontrast examination. No discrete renal lesions. No urine obstruction or perinephric stranding. Normal appearance of the bilateral adrenal glands. Normal appearance of the urinary bladder given degree distention. Stomach/Bowel: The bowel is normal in course and caliber without wall thickening or evidence of enteric obstruction. Normal appearance of the terminal ileum and appendix. No pneumoperitoneum, pneumatosis or portal venous gas. Vascular/Lymphatic: Moderate amount of mixed calcified and noncalcified atherosclerotic plaque within a normal caliber abdominal aorta. The major Fahs vessels of the abdominal aorta appear patent on this non CTA examination. As above, both the splenic artery and vein remain widely patent without discrete area of vessel irregularity or contrast extravasation. Shotty porta hepatis lymph nodes are not enlarged by size criteria with index porta hepatis lymph node measuring 0.9 cm in greatest short axis diameter (26, series  2), presumably reactive in etiology. No bulky retroperitoneal, mesenteric, pelvic or inguinal lymphadenopathy. Reproductive: Normal appearance of the prostate gland. A punctate phlebolith is seen within the left hemipelvis. No free fluid in the pelvic cul-de-sac. Other: Regional soft tissues appear normal. Musculoskeletal: No acute or aggressive osseous abnormalities. Mild-to-moderate degenerative change of the bilateral facets of the lower lumbar spine. IMPRESSION: 1. Findings compatible with acute uncomplicated pancreatitis. No evidence of pancreatic necrosis or definable/drainable fluid collection/pseudocyst. 2. Suspected hepatic steatosis. Correlation with LFTs is recommended. 3. Hepatic cysts. 4.  Aortic Atherosclerosis (ICD10-170.0) Electronically Signed   By: Simonne Come M.D.   On: 02/24/2016 20:30   Scheduled Meds: . amLODipine  5 mg Oral Daily  . atenolol  25 mg Oral Daily  . enoxaparin (LOVENOX) injection  40 mg Subcutaneous QHS  . folic acid  1 mg Oral Daily  . meloxicam  7.5 mg Oral Daily  . multivitamin with minerals  1 tablet Oral Daily  . pravastatin  20 mg Oral q1800  . thiamine  100 mg Oral Daily   Continuous Infusions: . sodium chloride 125 mL/hr at 02/26/16 1194    Principal Problem:   Acute alcoholic pancreatitis Active Problems:   HTN (hypertension)   Pancreatitis    Time spent: >35 minutes     Esperanza Sheets  [facility] Pager [phone]. If 7PM-7AM, please contact night-coverage at [email or web address], password [facility] 02/26/2016, 10:41 AM  LOS: 1 day

## 2016-02-26 NOTE — Plan of Care (Signed)
Problem: Respiratory: Goal: Ability to achieve and maintain a regular respiratory rate will improve Outcome: Progressing Pt is sitting up in chair

## 2016-02-27 DIAGNOSIS — J189 Pneumonia, unspecified organism: Secondary | ICD-10-CM

## 2016-02-27 LAB — LIPASE, BLOOD: LIPASE: 68 U/L — AB (ref 11–51)

## 2016-02-27 LAB — BASIC METABOLIC PANEL
ANION GAP: 6 (ref 5–15)
BUN: <5 mg/dL — ABNORMAL LOW (ref 6–20)
CALCIUM: 8.8 mg/dL — AB (ref 8.9–10.3)
CO2: 25 mmol/L (ref 22–32)
Chloride: 105 mmol/L (ref 101–111)
Creatinine, Ser: 0.68 mg/dL (ref 0.61–1.24)
GFR calc non Af Amer: >60 mL/min (ref >60)
GLUCOSE: 88 mg/dL (ref 65–99)
POTASSIUM: 3.5 mmol/L (ref 3.5–5.1)
Sodium: 136 mmol/L (ref 135–145)

## 2016-02-27 LAB — CBC
HEMATOCRIT: 36.2 % — AB (ref 39.0–52.0)
HEMOGLOBIN: 12.5 g/dL — AB (ref 13.0–17.0)
MCH: 35 pg — AB (ref 26.0–34.0)
MCHC: 34.5 g/dL (ref 30.0–36.0)
MCV: 101.4 fL — ABNORMAL HIGH (ref 78.0–100.0)
Platelets: 143 10*3/uL — ABNORMAL LOW (ref 150–400)
RBC: 3.57 MIL/uL — AB (ref 4.22–5.81)
RDW: 13.1 % (ref 11.5–15.5)
WBC: 7.8 10*3/uL (ref 4.0–10.5)

## 2016-02-27 LAB — HEPATITIS PANEL, ACUTE
HCV Ab: 0.1 s/co ratio (ref 0.0–0.9)
HEP A IGM: NEGATIVE
Hep B C IgM: NEGATIVE
Hepatitis B Surface Ag: NEGATIVE

## 2016-02-27 MED ORDER — AMLODIPINE BESYLATE 5 MG PO TABS
5.0000 mg | ORAL_TABLET | Freq: Every day | ORAL | Status: DC
  Administered 2016-02-27 – 2016-02-28 (×2): 5 mg via ORAL
  Filled 2016-02-27 (×2): qty 1

## 2016-02-27 NOTE — Progress Notes (Signed)
[facility] PROGRESS NOTE  Nicholas Blevins VCB:[id number] DOB: 04-16-1951 DOA: 02/24/2016 PCP: [name], MD  Assessment/Plan: 65 y/o male with PMH of HTN, HPL, Alcoholism presented with abdominal pains, nausea, vomiting. Found to have acute pancreatitis with lipase at 939 on admission. 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 CT abd: uncomplicated pancreatitis. No evidence of pancreatic necrosis or definable/drainable fluid collection/pseudocyst.  -lipase is trending down,  pt reports feeling better, cont supportive care, IVF, antiemetics as needed, diet as tolerated. D/c hctz  Pneumonia. Fevers are resolving. Started on iv levofloxacin. Deescalate to oral regimen in 24-48 hrs   Alcoholism. Mild withdrawals. Cont monitor on CIWA.  HTN (hypertension), essential. cont atenolol, added amlodipine    Code Status: full Family Communication: d/w patient, her friend  (indicate person spoken with, relationship, and if by phone, the number) Disposition Plan: home when medically ready 24-48 hrs     Consultants:  none  Procedures:  none  Antibiotics:  none (indicate start date, and stop date if known)  HPI/Subjective: Alert, no distress   Objective: Vitals:   02/26/16 2011 02/27/16 0438  BP: (!) 153/74 (!) 159/70  Pulse: 84 86  Resp: 18 16  Temp: 100.2 F (37.9 C) 99.3 F (37.4 C)    Intake/Output Summary (Last 24 hours) at 02/27/16 1005 Last data filed at 02/27/16 0800  Gross per 24 hour  Intake          2732.92 ml  Output             4020 ml  Net         -1287.08 ml   Filed Weights   02/24/16 1535 02/25/16 2004 02/26/16 2011  Weight: 71.7 kg (158 lb) 71.9 kg (158 lb 8 oz) 72 kg (158 lb 11.2 oz)    Exam:   General:  Comfortable   Cardiovascular: s1,s2 rrr  Respiratory: few crackles LL  Abdomen: soft, mild epigastric tender    Musculoskeletal: no edema    Data Reviewed: Basic Metabolic Panel:  Recent Labs Lab 02/24/16 1545 02/25/16 0457 02/26/16 0522 02/27/16 0523  NA 139 135 136 136  K 3.7 3.5 3.3* 3.5  CL 97* 99* 102 105  CO2 29 27 25 25   GLUCOSE 120* 96 78 88  BUN 10 11 6  <5*  CREATININE 0.88 0.80 0.73 0.68  CALCIUM 10.3 9.0 8.4* 8.8*   Liver Function Tests:  Recent Labs Lab 02/24/16 1545 02/25/16 0457 02/26/16 0522  AST 46* 28 23  ALT 38 27 20  ALKPHOS 74 56 48  BILITOT 0.6 0.8 0.9  PROT 8.2* 6.4* 5.8*  ALBUMIN 4.7 3.6 3.2*    Recent Labs Lab 02/24/16 1545 02/26/16 0522 02/27/16 0523  LIPASE 939* 177* 68*   No results for input(s): AMMONIA in the last 168 hours. CBC:  Recent Labs Lab 02/24/16 1545 02/25/16 0457 02/26/16 0522 02/27/16 0523  WBC 8.0 5.8 6.9 7.8  HGB 15.2 13.7 12.0* 12.5*  HCT 42.3 38.5* 34.0* 36.2*  MCV 101.2* 100.8* 100.6* 101.4*  PLT 189 156 131* 143*   Cardiac Enzymes: No results for input(s): CKTOTAL, CKMB, CKMBINDEX, TROPONINI in the last 168 hours. BNP (last 3 results) No results for input(s): BNP in the last 8760 hours.  ProBNP (last 3 results) No results for input(s): PROBNP in the last 8760 hours.  CBG: No results for input(s): GLUCAP in the last 168 hours.  No results  found for this or any previous visit (from the past 240 hour(s)).   Studies: Dg Chest 2 View  Result Date: 02/26/2016 CLINICAL DATA:  Acute onset of fever. EXAM: CHEST  2 VIEW COMPARISON:  06/01/2015. FINDINGS: Cardiomediastinal silhouette unremarkable, unchanged. Streaky and patchy opacities in the lingula and left lower lobe. Lungs otherwise clear. Possible small left pleural effusion. Visualized bony thorax intact. IMPRESSION: Atelectasis and/or bronchopneumonia involving the lingula and left lower lobe. Possible small left pleural effusion. Electronically Signed   By: Hulan Saas M.D.   On: 02/26/2016 12:45   Scheduled Meds: . atenolol  25 mg Oral Daily  .  enoxaparin (LOVENOX) injection  40 mg Subcutaneous QHS  . folic acid  1 mg Oral Daily  . levofloxacin (LEVAQUIN) IV  750 mg Intravenous Q24H  . meloxicam  7.5 mg Oral Daily  . multivitamin with minerals  1 tablet Oral Daily  . pravastatin  20 mg Oral q1800  . thiamine  100 mg Oral Daily   Continuous Infusions: . 0.9 % NaCl with KCl 20 mEq / L 125 mL/hr at 02/27/16 4098    Principal Problem:   Acute alcoholic pancreatitis Active Problems:   HTN (hypertension)   Pancreatitis    Time spent: >35 minutes     Esperanza Sheets  [facility] Pager [phone]. If 7PM-7AM, please contact night-coverage at [email or web address], password [facility] 02/27/2016, 10:05 AM  LOS: 2 days

## 2016-02-28 DIAGNOSIS — K852 Alcohol induced acute pancreatitis without necrosis or infection: Principal | ICD-10-CM

## 2016-02-28 MED ORDER — LEVOFLOXACIN 750 MG PO TABS: 750.0000 mg | ORAL_TABLET | Freq: Every day | ORAL | 0 refills | Status: DC

## 2016-02-28 NOTE — Discharge Instructions (Signed)
Follow with Primary MD GUEST, Loretha Stapler, MD in 7 days   Get CBC, CMP, 2 view Chest X ray checked  by Primary MD or SNF MD in 5-7 days ( we routinely change or add medications that can affect your baseline labs and fluid status, therefore we recommend that you get the mentioned basic workup next visit with your PCP, your PCP may decide not to get them or add new tests based on their clinical decision)   Activity: As tolerated with Full fall precautions use walker/cane & assistance as needed   Disposition Home    Diet:   Heart Healthy    For Heart failure patients - Check your Weight same time everyday, if you gain over 2 pounds, or you develop in leg swelling, experience more shortness of breath or chest pain, call your Primary MD immediately. Follow Cardiac Low Salt Diet and 1.5 lit/day fluid restriction.   On your next visit with your primary care physician please Get Medicines reviewed and adjusted.   Please request your Prim.MD to go over all Hospital Tests and Procedure/Radiological results at the follow up, please get all Hospital records sent to your Prim MD by signing hospital release before you go home.   If you experience worsening of your admission symptoms, develop shortness of breath, life threatening emergency, suicidal or homicidal thoughts you must seek medical attention immediately by calling 911 or calling your MD immediately  if symptoms less severe.  You Must read complete instructions/literature along with all the possible adverse reactions/side effects for all the Medicines you take and that have been prescribed to you. Take any new Medicines after you have completely understood and accpet all the possible adverse reactions/side effects.   Do not drive, operate heavy machinery, perform activities at heights, swimming or participation in water activities or provide baby sitting services if your were admitted for syncope or siezures until you have seen by Primary MD or  a Neurologist and advised to do so again.  Do not drive when taking Pain medications.    Do not take more than prescribed Pain, Sleep and Anxiety Medications  Special Instructions: If you have smoked or chewed Tobacco  in the last 2 yrs please stop smoking, stop any regular Alcohol  and or any Recreational drug use.  Wear Seat belts while driving.   Please note  You were cared for by a hospitalist during your hospital stay. If you have any questions about your discharge medications or the care you received while you were in the hospital after you are discharged, you can call the unit and asked to speak with the hospitalist on call if the hospitalist that took care of you is not available. Once you are discharged, your primary care physician will handle any further medical issues. Please note that NO REFILLS for any discharge medications will be authorized once you are discharged, as it is imperative that you return to your primary care physician (or establish a relationship with a primary care physician if you do not have one) for your aftercare needs so that they can reassess your need for medications and monitor your lab values.

## 2016-02-28 NOTE — Discharge Summary (Signed)
Nicholas Blevins:[id number] DOB: 1950/08/28 DOA: 02/24/2016  PCP: [name], MD  Admit date: 02/24/2016  Discharge date: 02/28/2016  Admitted From: Home   Disposition:  Home   Recommendations for Outpatient Follow-up:   Follow up with PCP in 1-2 weeks  PCP Please obtain BMP/CBC, 2 view CXR in 1week,  (see Discharge instructions)   PCP Please follow up on the following pending results: None   Home Health: None   Equipment/Devices: None  Consultations: None Discharge Condition: stable   CODE STATUS: Full   Diet Recommendation: Heart Healthy   Chief Complaint  Patient presents with  . Abdominal Pain     Brief history of present illness from the day of admission and additional interim summary    Nicholas Blevins is a 65 y.o. male with medical history significant of HLD, HTN.  Patient presents to the ED with c/o epigastric abdominal pain and nausea.  Symptoms onset 2 days ago.  Worsening gradually.  Radiates to back.  Admits that they "over did it" with EtOH use this weekend due to family reunion.  No PMH of pancreatitis previously, no diarrhea or constipation.  Did have dry heaves but no actual vomiting.  No appetite today.  ED Course: Mild Acute pancreatitis.   Hospital issues addressed     1.Acute alcoholic pancreatitis. Resolved. No signs of DTs, patient counseled to quit alcohol, outpatient follow-up with PCP request PCP to check CBC, CMP in a week's time. May consider outpatient GI follow-up. Kindly review CT scan report below in detail.  2. Essential hypertension. Continue home medications unchanged.  3. CAP. 3 more days of oral Levaquin.   Discharge diagnosis     Principal Problem:   Acute alcoholic pancreatitis Active Problems:   HTN (hypertension)    Pancreatitis    Discharge instructions    Discharge Instructions    Diet - low sodium heart healthy    Complete by:  As directed   Discharge instructions    Complete by:  As directed   Follow with Primary MD GUEST, Loretha Stapler, MD in 7 days   Get CBC, CMP, 2 view Chest X ray checked  by Primary MD or SNF MD in 5-7 days ( we routinely change or add medications that can affect your baseline labs and fluid status, therefore we recommend that you get the mentioned basic workup next visit with your PCP, your PCP may decide not to get them or add new tests based on their clinical decision)   Activity: As tolerated with Full fall precautions use walker/cane & assistance as needed   Disposition Home    Diet:   Heart Healthy    For Heart failure patients - Check your Weight same time everyday, if you gain over 2 pounds, or you develop in leg swelling, experience more shortness of breath or chest pain, call your Primary MD immediately. Follow Cardiac Low Salt Diet and 1.5 lit/day fluid restriction.   On your next visit with your primary care physician please Get Medicines  reviewed and adjusted.   Please request your Prim.MD to go over all Hospital Tests and Procedure/Radiological results at the follow up, please get all Hospital records sent to your Prim MD by signing hospital release before you go home.   If you experience worsening of your admission symptoms, develop shortness of breath, life threatening emergency, suicidal or homicidal thoughts you must seek medical attention immediately by calling 911 or calling your MD immediately  if symptoms less severe.  You Must read complete instructions/literature along with all the possible adverse reactions/side effects for all the Medicines you take and that have been prescribed to you. Take any new Medicines after you have completely understood and accpet all the possible adverse reactions/side effects.   Do not drive, operate heavy  machinery, perform activities at heights, swimming or participation in water activities or provide baby sitting services if your were admitted for syncope or siezures until you have seen by Primary MD or a Neurologist and advised to do so again.  Do not drive when taking Pain medications.    Do not take more than prescribed Pain, Sleep and Anxiety Medications  Special Instructions: If you have smoked or chewed Tobacco  in the last 2 yrs please stop smoking, stop any regular Alcohol  and or any Recreational drug use.  Wear Seat belts while driving.   Please note  You were cared for by a hospitalist during your hospital stay. If you have any questions about your discharge medications or the care you received while you were in the hospital after you are discharged, you can call the unit and asked to speak with the hospitalist on call if the hospitalist that took care of you is not available. Once you are discharged, your primary care physician will handle any further medical issues. Please note that NO REFILLS for any discharge medications will be authorized once you are discharged, as it is imperative that you return to your primary care physician (or establish a relationship with a primary care physician if you do not have one) for your aftercare needs so that they can reassess your need for medications and monitor your lab values.   Increase activity slowly    Complete by:  As directed      Discharge Medications     Medication List    TAKE these medications   aspirin EC 81 MG tablet Take 81 mg by mouth daily.   atenolol 25 MG tablet Commonly known as:  TENORMIN Take 25 mg by mouth daily.   CENTRUM SILVER 50+MEN Tabs Take 1 tablet by mouth daily with breakfast.   ferrous sulfate 325 (65 FE) MG tablet Take 325 mg by mouth daily with breakfast.   hydrochlorothiazide 25 MG tablet Commonly known as:  HYDRODIURIL Take 25 mg by mouth every morning.   levofloxacin 750 MG  tablet Commonly known as:  LEVAQUIN Take 1 tablet (750 mg total) by mouth daily.   lovastatin 20 MG tablet Commonly known as:  MEVACOR Take 1 tablet (20 mg total) by mouth at bedtime. PATIENT NEEDS OFFICE VISIT/LABS FOR ADDITIONAL REFILLS What changed:  additional instructions   meloxicam 15 MG tablet Commonly known as:  MOBIC Take 15 mg by mouth daily as needed. For inflammation   pantoprazole 40 MG tablet Commonly known as:  PROTONIX Take 40 mg by mouth daily as needed. For symptoms       Follow-up Information    GUEST, Loretha Stapler, MD. Schedule an appointment as soon as possible for  a visit in 1 week(s).   Specialty:  Internal Medicine Contact information: [address] [place] [address] [phone]           Major procedures and Radiology Reports - PLEASE review detailed and final reports thoroughly  -        Dg Chest 2 View  Result Date: 02/26/2016 CLINICAL DATA:  Acute onset of fever. EXAM: CHEST  2 VIEW COMPARISON:  06/01/2015. FINDINGS: Cardiomediastinal silhouette unremarkable, unchanged. Streaky and patchy opacities in the lingula and left lower lobe. Lungs otherwise clear. Possible small left pleural effusion. Visualized bony thorax intact. IMPRESSION: Atelectasis and/or bronchopneumonia involving the lingula and left lower lobe. Possible small left pleural effusion. Electronically Signed   By: Hulan Saas M.D.   On: 02/26/2016 12:45  Ct Abdomen Pelvis W Contrast  Result Date: 02/24/2016 CLINICAL DATA:  Follow-up pancreatitis. EXAM: CT ABDOMEN AND PELVIS WITH CONTRAST TECHNIQUE: Multidetector CT imaging of the abdomen and pelvis was performed using the standard protocol following bolus administration of intravenous contrast. CONTRAST:  100 ISOVUE-300 IOPAMIDOL (ISOVUE-300) INJECTION 61% COMPARISON:  None. FINDINGS: Lower chest: Limited visualization of the lower thorax demonstrates minimal dependent subpleural ground-glass atelectasis, right greater  than left. No focal airspace opacities. No pleural effusion. Normal heart size. Coronary artery calcifications. No pericardial effusion. Hepatobiliary: Normal hepatic contour. There is diffuse decreased attenuation of the hepatic parenchyma suggestive of hepatic steatosis. There are multiple hypo attenuating hepatic lesions with dominant adjacent lesions within in the right lobe of the liver demonstrating Hounsfield units compatible with hepatic cysts. Additional smaller sub cm hypoattenuating liver lesions are too small to adequately characterize of favored to represent additional hepatic cysts. Normal appearance of the gallbladder given underdistention. No definite radiopaque gallstones. No intra extrahepatic bili duct dilatation. No ascites. Pancreas: There is rather extensive stranding about the pancreatic and compatible with provided history of acute pancreatitis. There is homogeneous enhancement of the pancreatic parenchyma. No discrete pancreatic mass or pancreatic ductal dilatation on this non pancreatic protocol CT scan. No definable/drainable peripancreatic fluid collection. The splenic artery and vein appear patent without evidence of vessel irregularity or contrast extravasation. Spleen: Normal appearance of the spleen. Note is made of a punctate splenule. Adrenals/Urinary Tract: There is homogeneous enhancement and excretion of the bilateral kidneys. No definite renal stones in this postcontrast examination. No discrete renal lesions. No urine obstruction or perinephric stranding. Normal appearance of the bilateral adrenal glands. Normal appearance of the urinary bladder given degree distention. Stomach/Bowel: The bowel is normal in course and caliber without wall thickening or evidence of enteric obstruction. Normal appearance of the terminal ileum and appendix. No pneumoperitoneum, pneumatosis or portal venous gas. Vascular/Lymphatic: Moderate amount of mixed calcified and noncalcified atherosclerotic  plaque within a normal caliber abdominal aorta. The major [place] vessels of the abdominal aorta appear patent on this non CTA examination. As above, both the splenic artery and vein remain widely patent without discrete area of vessel irregularity or contrast extravasation. Shotty porta hepatis lymph nodes are not enlarged by size criteria with index porta hepatis lymph node measuring 0.9 cm in greatest short axis diameter (26, series 2), presumably reactive in etiology. No bulky retroperitoneal, mesenteric, pelvic or inguinal lymphadenopathy. Reproductive: Normal appearance of the prostate gland. A punctate phlebolith is seen within the left hemipelvis. No free fluid in the pelvic cul-de-sac. Other: Regional soft tissues appear normal. Musculoskeletal: No acute or aggressive osseous abnormalities. Mild-to-moderate degenerative change of the bilateral facets of the lower lumbar spine. IMPRESSION: 1. Findings compatible  with acute uncomplicated pancreatitis. No evidence of pancreatic necrosis or definable/drainable fluid collection/pseudocyst. 2. Suspected hepatic steatosis. Correlation with LFTs is recommended. 3. Hepatic cysts. 4.  Aortic Atherosclerosis (ICD10-170.0) Electronically Signed   By: Simonne Come M.D.   On: 02/24/2016 20:30   Micro Results     No results found for this or any previous visit (from the past 240 hour(s)).  Today   Subjective    Jasiel Belisle today has no headache,no chest abdominal pain,no new weakness tingling or numbness, feels much better wants to go home today    Objective   Blood pressure 132/72, pulse 71, temperature 99.3 F (37.4 C), temperature source Oral, resp. rate 16, height 5\' 8"  (1.727 m), weight 72 kg (158 lb 11.2 oz), SpO2 98 %.   Intake/Output Summary (Last 24 hours) at 02/28/16 0840 Last data filed at 02/28/16 0539  Gross per 24 hour  Intake             1470 ml  Output              875 ml  Net              595 ml    Exam Awake Alert, Oriented x  3, No new F.N deficits, Normal affect [place].AT,PERRAL Supple Neck,No JVD, No cervical lymphadenopathy appriciated.  Symmetrical Chest wall movement, Good air movement bilaterally, CTAB RRR,No Gallops,Rubs or new Murmurs, No Parasternal Heave +ve B.Sounds, Abd Soft, Non tender, No organomegaly appriciated, No rebound -guarding or rigidity. No Cyanosis, Clubbing or edema, No new Rash or bruise   Data Review   CBC w Diff:  Lab Results  Component Value Date   WBC 7.8 02/27/2016   HGB 12.5 (L) 02/27/2016   HCT 36.2 (L) 02/27/2016   PLT 143 (L) 02/27/2016    CMP:  Lab Results  Component Value Date   NA 136 02/27/2016   K 3.5 02/27/2016   CL 105 02/27/2016   CO2 25 02/27/2016   BUN <5 (L) 02/27/2016   CREATININE 0.68 02/27/2016   CREATININE 0.83 04/19/2012   PROT 5.8 (L) 02/26/2016   ALBUMIN 3.2 (L) 02/26/2016   BILITOT 0.9 02/26/2016   ALKPHOS 48 02/26/2016   AST 23 02/26/2016   ALT 20 02/26/2016  .   Total Time in preparing paper work, data evaluation and todays exam - 35 minutes  Leroy Sea M.D on 02/28/2016 at 8:40 AM  [facility]   Office  [phone]

## 2016-07-07 ENCOUNTER — Encounter (HOSPITAL_COMMUNITY): Payer: Self-pay | Admitting: Emergency Medicine

## 2016-07-07 ENCOUNTER — Emergency Department (HOSPITAL_COMMUNITY)
Admission: EM | Admit: 2016-07-07 | Discharge: 2016-07-07 | Disposition: A | Discharge status: Home / Self Care | Payer: Managed Care, Other (non HMO) | Attending: Emergency Medicine | Admitting: Emergency Medicine

## 2016-07-07 DIAGNOSIS — I10 Essential (primary) hypertension: Secondary | ICD-10-CM | POA: Insufficient documentation

## 2016-07-07 DIAGNOSIS — F17200 Nicotine dependence, unspecified, uncomplicated: Secondary | ICD-10-CM | POA: Diagnosis not present

## 2016-07-07 DIAGNOSIS — Z7982 Long term (current) use of aspirin: Secondary | ICD-10-CM | POA: Diagnosis not present

## 2016-07-07 DIAGNOSIS — H578 Other specified disorders of eye and adnexa: Secondary | ICD-10-CM | POA: Diagnosis present

## 2016-07-07 DIAGNOSIS — H1131 Conjunctival hemorrhage, right eye: Secondary | ICD-10-CM | POA: Insufficient documentation

## 2016-07-07 MED ORDER — SULFACETAMIDE SODIUM 10 % OP SOLN: 1.0000 [drp] | OPHTHALMIC | 0 refills | Status: AC

## 2016-07-07 MED ORDER — TETRACAINE HCL 0.5 % OP SOLN
1.0000 [drp] | Freq: Once | OPHTHALMIC | Status: AC
  Administered 2016-07-07: 1 [drp] via OPHTHALMIC
  Filled 2016-07-07: qty 4

## 2016-07-07 MED ORDER — FLUORESCEIN SODIUM 1 MG OP STRP
1.0000 | ORAL_STRIP | Freq: Once | OPHTHALMIC | Status: AC
  Administered 2016-07-07: 1 via OPHTHALMIC
  Filled 2016-07-07: qty 1

## 2016-07-07 NOTE — ED Provider Notes (Signed)
[facility]-EMERGENCY DEPT Provider Note   CSN: [id number] Arrival date & time: 07/07/16  1835     History   Chief Complaint Chief Complaint  Patient presents with  . Eye Problem    HPI Nicholas Blevins is a 65 y.o. male.  HPI Patient presents to the emergency department with right eye irritation.  Started on Tuesday.  The patient states he was rubbing his eye when he noticed later on that his eye had some blood in it.  The patient states that nothing seems to make the condition better or worse.  She states there is no pain and no visual disturbance.  Patient states that he did not use any ointments or drops in his eye Past Medical History:  Diagnosis Date  . High cholesterol   . Hypertension     Patient Active Problem List   Diagnosis Date Noted  . Acute alcoholic pancreatitis 02/24/2016  . Pancreatitis 02/24/2016  . HTN (hypertension) 12/19/2012  . High cholesterol 12/19/2012    History reviewed. No pertinent surgical history.     Home Medications    Prior to Admission medications   Medication Sig Start Date End Date Taking? Authorizing Provider  aspirin EC 81 MG tablet Take 81 mg by mouth daily.    Historical Provider, MD  atenolol (TENORMIN) 25 MG tablet Take 25 mg by mouth daily.    Historical Provider, MD  ferrous sulfate 325 (65 FE) MG tablet Take 325 mg by mouth daily with breakfast.    Historical Provider, MD  hydrochlorothiazide (HYDRODIURIL) 25 MG tablet Take 25 mg by mouth every morning. 01/14/16   Historical Provider, MD  levofloxacin (LEVAQUIN) 750 MG tablet Take 1 tablet (750 mg total) by mouth daily. 02/28/16   Leroy SeaPrashant K Singh, MD  lovastatin (MEVACOR) 20 MG tablet Take 1 tablet (20 mg total) by mouth at bedtime. PATIENT NEEDS OFFICE VISIT/LABS FOR ADDITIONAL REFILLS Patient taking differently: Take 20 mg by mouth at bedtime.  02/23/13   Gwenlyn FoundJessica C Copland, MD  meloxicam (MOBIC) 15 MG tablet Take 15 mg by mouth daily as needed. For inflammation 01/14/16    Historical Provider, MD  Multiple Vitamins-Minerals (CENTRUM SILVER 50+MEN) TABS Take 1 tablet by mouth daily with breakfast.    Historical Provider, MD  pantoprazole (PROTONIX) 40 MG tablet Take 40 mg by mouth daily as needed. For symptoms 01/14/16   Historical Provider, MD    Family History History reviewed. No pertinent family history.  Social History Social History  Substance Use Topics  . Smoking status: Current Every Day Smoker  . Smokeless tobacco: Never Used  . Alcohol use Yes     Allergies   Patient has no known allergies.   Review of Systems Review of Systems  All other systems negative except as documented in the HPI. All pertinent positives and negatives as reviewed in the HPI. Physical Exam Updated Vital Signs BP 153/84 (BP Location: Left Arm)   Pulse 78   Temp 98.6 F (37 C) (Oral)   Resp 18   SpO2 98%   Physical Exam  Constitutional: He appears well-developed and well-nourished. No distress.  HENT:  Head: Normocephalic and atraumatic.  Eyes: Right conjunctiva has a hemorrhage.  Fundoscopic exam:      The right eye shows no AV nicking, no exudate and no hemorrhage.    Pulmonary/Chest: Effort normal.     ED Treatments / Results  Labs (all labs ordered are listed, but only abnormal results are displayed) Labs Reviewed - No data  to display  EKG  EKG Interpretation None       Radiology No results found.  Procedures Procedures (including critical care time)  Medications Ordered in ED Medications  tetracaine (PONTOCAINE) 0.5 % ophthalmic solution 1 drop (not administered)  fluorescein ophthalmic strip 1 strip (not administered)     Initial Impression / Assessment and Plan / ED Course  I have reviewed the triage vital signs and the nursing notes.  Pertinent labs & imaging results that were available during my care of the patient were reviewed by me and considered in my medical decision making (see chart for details).  Clinical Course      We will give the patient.ophthalmology referral for a subconjunctival hemorrhage.  There is some uptake of the dye over the Subconjuctival hemorrhage.   Final Clinical Impressions(s) / ED Diagnoses   Final diagnoses:  None    New Prescriptions New Prescriptions   No medications on file     Charlestine NightChristopher Cordelle Dahmen, PA-C 07/07/16 1932    Jacalyn LefevreJulie Haviland, MD 07/07/16 2344

## 2016-07-07 NOTE — ED Notes (Signed)
Visual acuity screening compl2ted  ; right eye read at 20/20 and left eye 20/30.

## 2016-07-07 NOTE — ED Triage Notes (Signed)
Pt c/o right eye irritation and redness onset Tuesday after feeling something was in his eye. No visual changes. No pain with EOMs or consensual pupillary response. Conjunctiva red.

## 2016-07-07 NOTE — Discharge Instructions (Signed)
Return here as needed. Follow up with the eye doctor as needed. The blood will resolve over the next few weeks.

## 2017-01-24 ENCOUNTER — Encounter (HOSPITAL_COMMUNITY): Payer: Self-pay

## 2017-01-24 DIAGNOSIS — Z7982 Long term (current) use of aspirin: Secondary | ICD-10-CM

## 2017-01-24 DIAGNOSIS — F1010 Alcohol abuse, uncomplicated: Secondary | ICD-10-CM | POA: Diagnosis present

## 2017-01-24 DIAGNOSIS — K852 Alcohol induced acute pancreatitis without necrosis or infection: Principal | ICD-10-CM | POA: Diagnosis present

## 2017-01-24 DIAGNOSIS — Z79899 Other long term (current) drug therapy: Secondary | ICD-10-CM

## 2017-01-24 DIAGNOSIS — F17210 Nicotine dependence, cigarettes, uncomplicated: Secondary | ICD-10-CM | POA: Diagnosis present

## 2017-01-24 DIAGNOSIS — E876 Hypokalemia: Secondary | ICD-10-CM | POA: Diagnosis present

## 2017-01-24 DIAGNOSIS — Z8249 Family history of ischemic heart disease and other diseases of the circulatory system: Secondary | ICD-10-CM

## 2017-01-24 DIAGNOSIS — D7589 Other specified diseases of blood and blood-forming organs: Secondary | ICD-10-CM | POA: Diagnosis present

## 2017-01-24 DIAGNOSIS — E7800 Pure hypercholesterolemia, unspecified: Secondary | ICD-10-CM | POA: Diagnosis present

## 2017-01-24 DIAGNOSIS — E785 Hyperlipidemia, unspecified: Secondary | ICD-10-CM | POA: Diagnosis present

## 2017-01-24 DIAGNOSIS — Z823 Family history of stroke: Secondary | ICD-10-CM

## 2017-01-24 DIAGNOSIS — K219 Gastro-esophageal reflux disease without esophagitis: Secondary | ICD-10-CM | POA: Diagnosis present

## 2017-01-24 DIAGNOSIS — K5900 Constipation, unspecified: Secondary | ICD-10-CM | POA: Diagnosis present

## 2017-01-24 DIAGNOSIS — I10 Essential (primary) hypertension: Secondary | ICD-10-CM | POA: Diagnosis present

## 2017-01-24 NOTE — ED Triage Notes (Signed)
Pt states that he started having central abd pain today, last BM tonight, some nausea, denies v/d. Denies fevers. Took pepto without relief

## 2017-01-25 ENCOUNTER — Encounter (HOSPITAL_COMMUNITY): Payer: Self-pay | Admitting: Internal Medicine

## 2017-01-25 ENCOUNTER — Inpatient Hospital Stay (HOSPITAL_COMMUNITY)
Admission: EM | Admit: 2017-01-25 | Discharge: 2017-01-27 | DRG: 440 | Disposition: A | Discharge status: Home / Self Care | Payer: Managed Care, Other (non HMO) | Attending: Internal Medicine | Admitting: Internal Medicine

## 2017-01-25 DIAGNOSIS — Z7982 Long term (current) use of aspirin: Secondary | ICD-10-CM | POA: Diagnosis not present

## 2017-01-25 DIAGNOSIS — F1010 Alcohol abuse, uncomplicated: Secondary | ICD-10-CM | POA: Diagnosis present

## 2017-01-25 DIAGNOSIS — E785 Hyperlipidemia, unspecified: Secondary | ICD-10-CM | POA: Diagnosis present

## 2017-01-25 DIAGNOSIS — K219 Gastro-esophageal reflux disease without esophagitis: Secondary | ICD-10-CM | POA: Diagnosis present

## 2017-01-25 DIAGNOSIS — K852 Alcohol induced acute pancreatitis without necrosis or infection: Secondary | ICD-10-CM | POA: Diagnosis present

## 2017-01-25 DIAGNOSIS — K5900 Constipation, unspecified: Secondary | ICD-10-CM | POA: Diagnosis present

## 2017-01-25 DIAGNOSIS — I10 Essential (primary) hypertension: Secondary | ICD-10-CM | POA: Diagnosis present

## 2017-01-25 DIAGNOSIS — E7800 Pure hypercholesterolemia, unspecified: Secondary | ICD-10-CM | POA: Diagnosis present

## 2017-01-25 DIAGNOSIS — E876 Hypokalemia: Secondary | ICD-10-CM | POA: Diagnosis present

## 2017-01-25 DIAGNOSIS — R0602 Shortness of breath: Secondary | ICD-10-CM

## 2017-01-25 DIAGNOSIS — Z8249 Family history of ischemic heart disease and other diseases of the circulatory system: Secondary | ICD-10-CM | POA: Diagnosis not present

## 2017-01-25 DIAGNOSIS — Z720 Tobacco use: Secondary | ICD-10-CM | POA: Diagnosis not present

## 2017-01-25 DIAGNOSIS — Z823 Family history of stroke: Secondary | ICD-10-CM | POA: Diagnosis not present

## 2017-01-25 DIAGNOSIS — F17210 Nicotine dependence, cigarettes, uncomplicated: Secondary | ICD-10-CM | POA: Diagnosis present

## 2017-01-25 DIAGNOSIS — Z79899 Other long term (current) drug therapy: Secondary | ICD-10-CM | POA: Diagnosis not present

## 2017-01-25 DIAGNOSIS — D7589 Other specified diseases of blood and blood-forming organs: Secondary | ICD-10-CM | POA: Diagnosis present

## 2017-01-25 HISTORY — DX: Gastro-esophageal reflux disease without esophagitis: K21.9

## 2017-01-25 HISTORY — DX: Alcohol induced acute pancreatitis without necrosis or infection: K85.20

## 2017-01-25 HISTORY — DX: Tobacco use: Z72.0

## 2017-01-25 LAB — COMPREHENSIVE METABOLIC PANEL
ALBUMIN: 4 g/dL (ref 3.5–5.0)
ALK PHOS: 56 U/L (ref 38–126)
ALK PHOS: 66 U/L (ref 38–126)
ALT: 39 U/L (ref 17–63)
ALT: 44 U/L (ref 17–63)
ANION GAP: 9 (ref 5–15)
ANION GAP: 10 (ref 5–15)
AST: 37 U/L (ref 15–41)
AST: 42 U/L — ABNORMAL HIGH (ref 15–41)
Albumin: 4 g/dL (ref 3.5–5.0)
BILIRUBIN TOTAL: 0.6 mg/dL (ref 0.3–1.2)
BILIRUBIN TOTAL: 0.4 mg/dL (ref 0.3–1.2)
BUN: 12 mg/dL (ref 6–20)
BUN: 13 mg/dL (ref 6–20)
CALCIUM: 8.7 mg/dL — AB (ref 8.9–10.3)
CALCIUM: 9 mg/dL (ref 8.9–10.3)
CO2: 23 mmol/L (ref 22–32)
CO2: 25 mmol/L (ref 22–32)
Chloride: 102 mmol/L (ref 101–111)
Chloride: 106 mmol/L (ref 101–111)
Creatinine, Ser: 0.77 mg/dL (ref 0.61–1.24)
Creatinine, Ser: 0.81 mg/dL (ref 0.61–1.24)
GFR calc non Af Amer: >60 mL/min (ref >60)
GLUCOSE: 114 mg/dL — AB (ref 65–99)
Glucose, Bld: 115 mg/dL — ABNORMAL HIGH (ref 65–99)
POTASSIUM: 4.1 mmol/L (ref 3.5–5.1)
POTASSIUM: 3 mmol/L — AB (ref 3.5–5.1)
SODIUM: 137 mmol/L (ref 135–145)
Sodium: 138 mmol/L (ref 135–145)
TOTAL PROTEIN: 6.6 g/dL (ref 6.5–8.1)
TOTAL PROTEIN: 7 g/dL (ref 6.5–8.1)

## 2017-01-25 LAB — URINALYSIS, ROUTINE W REFLEX MICROSCOPIC
Bilirubin Urine: NEGATIVE
Glucose, UA: NEGATIVE mg/dL
Hgb urine dipstick: NEGATIVE
Ketones, ur: NEGATIVE mg/dL
Leukocytes, UA: NEGATIVE
NITRITE: NEGATIVE
PH: 5 (ref 5.0–8.0)
Protein, ur: NEGATIVE mg/dL
SPECIFIC GRAVITY, URINE: 1.018 (ref 1.005–1.030)

## 2017-01-25 LAB — CBC
HEMATOCRIT: 40.5 % (ref 39.0–52.0)
HEMATOCRIT: 41.5 % (ref 39.0–52.0)
HEMOGLOBIN: 13.8 g/dL (ref 13.0–17.0)
HEMOGLOBIN: 14.2 g/dL (ref 13.0–17.0)
MCH: 35.3 pg — ABNORMAL HIGH (ref 26.0–34.0)
MCH: 35.3 pg — ABNORMAL HIGH (ref 26.0–34.0)
MCHC: 34.1 g/dL (ref 30.0–36.0)
MCHC: 34.2 g/dL (ref 30.0–36.0)
MCV: 103.2 fL — ABNORMAL HIGH (ref 78.0–100.0)
MCV: 103.6 fL — ABNORMAL HIGH (ref 78.0–100.0)
Platelets: 151 10*3/uL (ref 150–400)
Platelets: 156 10*3/uL (ref 150–400)
RBC: 3.91 MIL/uL — ABNORMAL LOW (ref 4.22–5.81)
RBC: 4.02 MIL/uL — AB (ref 4.22–5.81)
RDW: 14.2 % (ref 11.5–15.5)
RDW: 14.5 % (ref 11.5–15.5)
WBC: 6.8 10*3/uL (ref 4.0–10.5)
WBC: 7.5 10*3/uL (ref 4.0–10.5)

## 2017-01-25 LAB — HIV ANTIBODY (ROUTINE TESTING W REFLEX): HIV Screen 4th Generation wRfx: NONREACTIVE

## 2017-01-25 LAB — MAGNESIUM: MAGNESIUM: 2 mg/dL (ref 1.7–2.4)

## 2017-01-25 LAB — LIPID PANEL
CHOL/HDL RATIO: 4.6 ratio
Cholesterol: 192 mg/dL (ref 0–200)
HDL: 42 mg/dL (ref >40)
LDL CALC: 119 mg/dL — AB (ref 0–99)
Triglycerides: 157 mg/dL — ABNORMAL HIGH (ref <150)
VLDL: 31 mg/dL (ref 0–40)

## 2017-01-25 LAB — LIPASE, BLOOD
LIPASE: 1870 U/L — AB (ref 11–51)
Lipase: >10000 U/L — ABNORMAL HIGH (ref 11–51)

## 2017-01-25 LAB — TRIGLYCERIDES: TRIGLYCERIDES: 81 mg/dL (ref <150)

## 2017-01-25 MED ORDER — PANTOPRAZOLE SODIUM 40 MG PO TBEC
40.0000 mg | DELAYED_RELEASE_TABLET | Freq: Every day | ORAL | Status: DC
  Administered 2017-01-25 – 2017-01-27 (×3): 40 mg via ORAL
  Filled 2017-01-25 (×3): qty 1

## 2017-01-25 MED ORDER — LORAZEPAM 1 MG PO TABS: 1.0000 mg | ORAL_TABLET | Freq: Four times a day (QID) | ORAL | Status: DC | PRN

## 2017-01-25 MED ORDER — LORAZEPAM 2 MG/ML IJ SOLN: 0.0000 mg | Freq: Four times a day (QID) | INTRAMUSCULAR | Status: AC

## 2017-01-25 MED ORDER — ONDANSETRON HCL 4 MG/2ML IJ SOLN
4.0000 mg | Freq: Once | INTRAMUSCULAR | Status: AC
  Administered 2017-01-25: 4 mg via INTRAVENOUS
  Filled 2017-01-25: qty 2

## 2017-01-25 MED ORDER — THIAMINE HCL 100 MG/ML IJ SOLN
100.0000 mg | Freq: Every day | INTRAMUSCULAR | Status: DC
  Filled 2017-01-25: qty 2

## 2017-01-25 MED ORDER — PRAVASTATIN SODIUM 20 MG PO TABS
20.0000 mg | ORAL_TABLET | Freq: Every day | ORAL | Status: DC
  Administered 2017-01-25 – 2017-01-26 (×2): 20 mg via ORAL
  Filled 2017-01-25 (×2): qty 1

## 2017-01-25 MED ORDER — LORAZEPAM 2 MG/ML IJ SOLN: 1.0000 mg | Freq: Four times a day (QID) | INTRAMUSCULAR | Status: DC | PRN

## 2017-01-25 MED ORDER — POTASSIUM CHLORIDE 20 MEQ/15ML (10%) PO SOLN
40.0000 meq | Freq: Once | ORAL | Status: AC
  Administered 2017-01-25: 40 meq via ORAL
  Filled 2017-01-25: qty 30

## 2017-01-25 MED ORDER — LORAZEPAM 2 MG/ML IJ SOLN: 0.0000 mg | Freq: Two times a day (BID) | INTRAMUSCULAR | Status: DC

## 2017-01-25 MED ORDER — HYDROCODONE-ACETAMINOPHEN 7.5-325 MG PO TABS
1.0000 | ORAL_TABLET | ORAL | Status: DC | PRN
  Administered 2017-01-25 (×2): 2 via ORAL
  Filled 2017-01-25 (×2): qty 2

## 2017-01-25 MED ORDER — SODIUM CHLORIDE 0.9 % IV BOLUS (SEPSIS): 1000.0000 mL | INTRAVENOUS | Status: DC | PRN

## 2017-01-25 MED ORDER — FOLIC ACID 1 MG PO TABS
1.0000 mg | ORAL_TABLET | Freq: Every day | ORAL | Status: DC
  Administered 2017-01-25 – 2017-01-27 (×3): 1 mg via ORAL
  Filled 2017-01-25 (×3): qty 1

## 2017-01-25 MED ORDER — ASPIRIN EC 81 MG PO TBEC
81.0000 mg | DELAYED_RELEASE_TABLET | Freq: Every day | ORAL | Status: DC
  Administered 2017-01-25 – 2017-01-27 (×3): 81 mg via ORAL
  Filled 2017-01-25 (×3): qty 1

## 2017-01-25 MED ORDER — NICOTINE 21 MG/24HR TD PT24
21.0000 mg | MEDICATED_PATCH | Freq: Every day | TRANSDERMAL | Status: DC
  Administered 2017-01-25 (×2): 21 mg via TRANSDERMAL
  Filled 2017-01-25 (×2): qty 1

## 2017-01-25 MED ORDER — CHLORDIAZEPOXIDE HCL 5 MG PO CAPS: 5.0000 mg | ORAL_CAPSULE | Freq: Three times a day (TID) | ORAL | Status: DC

## 2017-01-25 MED ORDER — HYDROMORPHONE HCL 1 MG/ML IJ SOLN
1.0000 mg | Freq: Once | INTRAMUSCULAR | Status: AC
  Administered 2017-01-25: 1 mg via INTRAVENOUS
  Filled 2017-01-25: qty 1

## 2017-01-25 MED ORDER — MORPHINE SULFATE (PF) 4 MG/ML IV SOLN
2.0000 mg | INTRAVENOUS | Status: DC | PRN
  Administered 2017-01-25: 2 mg via INTRAVENOUS
  Filled 2017-01-25: qty 1

## 2017-01-25 MED ORDER — SODIUM CHLORIDE 0.9 % IV SOLN
INTRAVENOUS | Status: DC
  Administered 2017-01-25: 04:00:00 via INTRAVENOUS

## 2017-01-25 MED ORDER — VITAMIN B-1 100 MG PO TABS
100.0000 mg | ORAL_TABLET | Freq: Every day | ORAL | Status: DC
  Administered 2017-01-25 – 2017-01-27 (×3): 100 mg via ORAL
  Filled 2017-01-25 (×3): qty 1

## 2017-01-25 MED ORDER — SODIUM CHLORIDE 0.9 % IV SOLN
1000.0000 mL | INTRAVENOUS | Status: DC
  Administered 2017-01-25: 1000 mL via INTRAVENOUS

## 2017-01-25 MED ORDER — ADULT MULTIVITAMIN W/MINERALS CH
1.0000 | ORAL_TABLET | Freq: Every day | ORAL | Status: DC
  Administered 2017-01-25 – 2017-01-27 (×3): 1 via ORAL
  Filled 2017-01-25 (×3): qty 1

## 2017-01-25 MED ORDER — ONDANSETRON HCL 4 MG/2ML IJ SOLN: 4.0000 mg | Freq: Three times a day (TID) | INTRAMUSCULAR | Status: DC | PRN

## 2017-01-25 MED ORDER — ZOLPIDEM TARTRATE 5 MG PO TABS: 5.0000 mg | ORAL_TABLET | Freq: Every evening | ORAL | Status: DC | PRN

## 2017-01-25 MED ORDER — CENTRUM SILVER 50+MEN PO TABS: 1.0000 | ORAL_TABLET | Freq: Every day | ORAL | Status: DC

## 2017-01-25 MED ORDER — CHLORDIAZEPOXIDE HCL 5 MG PO CAPS
10.0000 mg | ORAL_CAPSULE | Freq: Three times a day (TID) | ORAL | Status: DC
Start: 2017-01-25 — End: 2017-01-27
  Administered 2017-01-25 – 2017-01-27 (×7): 10 mg via ORAL
  Filled 2017-01-25 (×7): qty 2

## 2017-01-25 MED ORDER — ENOXAPARIN SODIUM 40 MG/0.4ML ~~LOC~~ SOLN
40.0000 mg | SUBCUTANEOUS | Status: DC
  Administered 2017-01-25: 40 mg via SUBCUTANEOUS
  Filled 2017-01-25 (×3): qty 0.4

## 2017-01-25 MED ORDER — HYDRALAZINE HCL 20 MG/ML IJ SOLN: 5.0000 mg | INTRAMUSCULAR | Status: DC | PRN

## 2017-01-25 MED ORDER — SODIUM CHLORIDE 0.9 % IV BOLUS (SEPSIS)
1000.0000 mL | Freq: Once | INTRAVENOUS | Status: AC
  Administered 2017-01-25: 1000 mL via INTRAVENOUS

## 2017-01-25 MED ORDER — FERROUS SULFATE 325 (65 FE) MG PO TABS
325.0000 mg | ORAL_TABLET | Freq: Every day | ORAL | Status: DC
  Administered 2017-01-25 – 2017-01-27 (×3): 325 mg via ORAL
  Filled 2017-01-25 (×3): qty 1

## 2017-01-25 MED ORDER — ATENOLOL 25 MG PO TABS
25.0000 mg | ORAL_TABLET | Freq: Every day | ORAL | Status: DC
  Administered 2017-01-25 – 2017-01-27 (×3): 25 mg via ORAL
  Filled 2017-01-25 (×4): qty 1

## 2017-01-25 MED ORDER — SODIUM CHLORIDE 0.9 % IV SOLN
INTRAVENOUS | Status: AC
  Administered 2017-01-26: 04:00:00 via INTRAVENOUS

## 2017-01-25 NOTE — Progress Notes (Signed)
[email or web address] aa meetings listing

## 2017-01-25 NOTE — Progress Notes (Signed)
@IPLOG @        PROGRESS NOTE                                                                                                                                                                                                             Patient Demographics:    Nicholas Blevins, is a 66 y.o. male, DOB - [birth year], ZOX:[id number]  Admit date - 01/25/2017   Admitting Physician Lorretta Harp, MD  Outpatient Primary MD for the patient is Guest, Ashley Jacobs, MD  LOS - 0  Chief Complaint  Patient presents with  . Abdominal Pain       Brief Narrative  Nicholas Blevins is a 66 y.o. male with medical history significant of alcoholic pancreatitis, tobacco abuse, hypertension, hyperlipidemia, GERD, who presents with abdominal pain which started after two-week history of alcoholic binge. Was diagnosed with alcohol induced acute pancreatitis in the ER and admitted.   Subjective:    [name] today has, No headache, No chest pain, Improved epigastric abdominal pain - No Nausea, No new weakness tingling or numbness, No Cough - SOB.     Assessment  & Plan :     1.Acute alcoholic pancreatitis. Lipase was over 10,000, continue supportive care with bowel rest, IV fluids and pain medications, counseled to quit alcohol, triglycerides and liver enzymes are stable, continue to monitor with supportive care.  2. History of dyslipidemia. Continue pravastatin.  3. Hypertension currently on combination of atenolol and as needed IV hydralazine, HCTZ on hold.  4. Alcohol and nicotine abuse. Counseled to quit, on CIWA protocol, Librium scheduled added. On nicotine patch.  5. Hypokalemia. Replaced and stable.    Diet : Diet NPO time specified Except for: Sips with Meds, Ice Chips    Family Communication  :  None  Code Status :  Full  Disposition Plan  :  TBD  Consults  :  None  Procedures  :      DVT Prophylaxis  :  Lovenox    Lab Results  Component Value Date   PLT 151 01/25/2017    Inpatient  Medications  Scheduled Meds: . aspirin EC  81 mg Oral Daily  . atenolol  25 mg Oral Daily  . chlordiazePOXIDE  10 mg Oral TID  . enoxaparin (LOVENOX) injection  40 mg Subcutaneous Q24H  . ferrous sulfate  325 mg Oral Q breakfast  . folic acid  1 mg Oral Daily  . LORazepam  0-4 mg Intravenous Q6H   Followed by  . [START ON  01/27/2017] LORazepam  0-4 mg Intravenous Q12H  . multivitamin with minerals  1 tablet Oral Daily  . nicotine  21 mg Transdermal Daily  . pantoprazole  40 mg Oral Daily  . pravastatin  20 mg Oral q1800  . thiamine  100 mg Oral Daily   Or  . thiamine  100 mg Intravenous Daily   Continuous Infusions: . sodium chloride 125 mL/hr at 01/25/17 0615  . sodium chloride     PRN Meds:.hydrALAZINE, HYDROcodone-acetaminophen, LORazepam **OR** LORazepam, morphine injection, ondansetron (ZOFRAN) IV, sodium chloride, zolpidem  Antibiotics  :    Anti-infectives    None         Objective:   Vitals:   01/25/17 0200 01/25/17 0405 01/25/17 0411 01/25/17 0900  BP: 131/79  140/72 136/66  Pulse: 72  74   Resp: 19  18   Temp:   98 F (36.7 C)   TempSrc:   Oral   SpO2: 99%  97%   Weight:  74.8 kg (164 lb 14.5 oz)    Height:  5\' 8"  (1.727 m)      Wt Readings from Last 3 Encounters:  01/25/17 74.8 kg (164 lb 14.5 oz)  02/26/16 72 kg (158 lb 11.2 oz)  12/19/12 68 kg (150 lb)     Intake/Output Summary (Last 24 hours) at 01/25/17 1236 Last data filed at 01/25/17 0909  Gross per 24 hour  Intake          2150.83 ml  Output                0 ml  Net          2150.83 ml     Physical Exam  Awake Alert, Oriented X 3, No new F.N deficits,   .AT,PERRAL Supple Neck,No JVD, No cervical lymphadenopathy appriciated.  Symmetrical Chest wall movement, Good air movement bilaterally, CTAB RRR,No Gallops,Rubs or new Murmurs, No Parasternal Heave +ve B.Sounds, Abd Soft, mild epigastric tenderness, No organomegaly appriciated, No rebound - guarding or rigidity. No Cyanosis,  Clubbing or edema, No new Rash or bruise       Data Review:    CBC  Recent Labs Lab 01/24/17 2349 01/25/17 0337  WBC 7.5 6.8  HGB 14.2 13.8  HCT 41.5 40.5  PLT 156 151  MCV 103.2* 103.6*  MCH 35.3* 35.3*  MCHC 34.2 34.1  RDW 14.2 14.5    Chemistries   Recent Labs Lab 01/24/17 2349 01/25/17 0337  NA 137 138  K 3.0* 4.1  CL 102 106  CO2 25 23  GLUCOSE 114* 115*  BUN 13 12  CREATININE 0.81 0.77  CALCIUM 9.0 8.7*  MG  --  2.0  AST 42* 37  ALT 44 39  ALKPHOS 66 56  BILITOT 0.4 0.6   ------------------------------------------------------------------------------------------------------------------  Recent Labs  01/25/17 0337 01/25/17 0726  CHOL 192  --   HDL 42  --   LDLCALC 119*  --   TRIG 157* 81  CHOLHDL 4.6  --     No results found for: HGBA1C ------------------------------------------------------------------------------------------------------------------ No results for input(s): TSH, T4TOTAL, T3FREE, THYROIDAB in the last 72 hours.  Invalid input(s): FREET3 ------------------------------------------------------------------------------------------------------------------ No results for input(s): VITAMINB12, FOLATE, FERRITIN, TIBC, IRON, RETICCTPCT in the last 72 hours.  Coagulation profile No results for input(s): INR, PROTIME in the last 168 hours.  No results for input(s): DDIMER in the last 72 hours.  Cardiac Enzymes No results for input(s): CKMB, TROPONINI, MYOGLOBIN in the last 168 hours.  Invalid input(s):  CK ------------------------------------------------------------------------------------------------------------------ No results found for: BNP  Micro Results No results found for this or any previous visit (from the past 240 hour(s)).  Radiology Reports No results found.  Time Spent in minutes  30   Susa Raring M.D on 01/25/2017 at 12:36 PM  Between 7am to 7pm - Pager - [phone] ( page via [email or web address], text pages only,  please mention full 10 digit call back number). After 7pm go to [email or web address] - password [facility]

## 2017-01-25 NOTE — ED Provider Notes (Signed)
[facility]-EMERGENCY DEPT Provider Note   CSN: [id number] Arrival date & time: 01/24/17  2311  By signing my name below, I, Thelma Bargeick Cochran, attest that this documentation has been prepared under the direction and in the presence of Azalia Bilisampos, Tahji Parral, MD. Electronically Signed: Thelma Bargeick Cochran, Scribe. 01/25/17. 1:45 AM.  History   Chief Complaint Chief Complaint  Patient presents with  . Abdominal Pain   The history is provided by the patient. No language interpreter was used.   HPI Comments: Nicholas Blevins is a 66 y.o. male who presents to the Emergency Department complaining of constant, gradually worsening upper abdominal pain since 2 days. He states he ate lunch when his symptoms began. He has associated constipation. Pt notes he drinks Etoh most days. He denies nausea, vomiting, diarrhea, and fever. No pertinent medical histories.  Past Medical History:  Diagnosis Date  . High cholesterol   . Hypertension     Patient Active Problem List   Diagnosis Date Noted  . Acute alcoholic pancreatitis 02/24/2016  . Pancreatitis 02/24/2016  . HTN (hypertension) 12/19/2012  . High cholesterol 12/19/2012    History reviewed. No pertinent surgical history.     Home Medications    Prior to Admission medications   Medication Sig Start Date End Date Taking? Authorizing Provider  aspirin EC 81 MG tablet Take 81 mg by mouth daily.    [provider]  atenolol (TENORMIN) 25 MG tablet Take 25 mg by mouth daily.    [provider]  ferrous sulfate 325 (65 FE) MG tablet Take 325 mg by mouth daily with breakfast.    [provider]  hydrochlorothiazide (HYDRODIURIL) 25 MG tablet Take 25 mg by mouth every morning. 01/14/16   [provider]  levofloxacin (LEVAQUIN) 750 MG tablet Take 1 tablet (750 mg total) by mouth daily. 02/28/16   Leroy SeaSingh, Prashant K, MD  lovastatin (MEVACOR) 20 MG tablet Take 1 tablet (20 mg total) by mouth at bedtime. PATIENT NEEDS OFFICE VISIT/LABS FOR  ADDITIONAL REFILLS Patient taking differently: Take 20 mg by mouth at bedtime.  02/23/13   Copland, Gwenlyn FoundJessica C, MD  meloxicam (MOBIC) 15 MG tablet Take 15 mg by mouth daily as needed. For inflammation 01/14/16   [provider]  Multiple Vitamins-Minerals (CENTRUM SILVER 50+MEN) TABS Take 1 tablet by mouth daily with breakfast.    [provider]  pantoprazole (PROTONIX) 40 MG tablet Take 40 mg by mouth daily as needed. For symptoms 01/14/16   [provider]    Family History No family history on file.  Social History Social History  Substance Use Topics  . Smoking status: Current Every Day Smoker  . Smokeless tobacco: Never Used  . Alcohol use Yes     Allergies   Patient has no known allergies.   Review of Systems Review of Systems  Constitutional: Negative for fever.  Gastrointestinal: Positive for abdominal pain and constipation. Negative for diarrhea, nausea and vomiting.  All other systems reviewed and are negative.    Physical Exam Updated Vital Signs BP (!) 92/55   Pulse 79   Temp 97.6 F (36.4 C)   Resp 20   Ht 5\' 8"  (1.727 m)   Wt 164 lb (74.4 kg)   SpO2 94%   BMI 24.94 kg/m   Physical Exam  Constitutional: He is oriented to person, place, and time. He appears well-developed and well-nourished.  HENT:  Head: Normocephalic and atraumatic.  Eyes: EOM are normal.  Neck: Normal range of motion.  Cardiovascular:  Normal rate, regular rhythm, normal heart sounds and intact distal pulses.   Pulmonary/Chest: Effort normal and breath sounds normal. No respiratory distress.  Abdominal: Soft. He exhibits no distension. There is tenderness.  Upper abdominal tenderness  Musculoskeletal: Normal range of motion.  Neurological: He is alert and oriented to person, place, and time.  Skin: Skin is warm and dry.  Psychiatric: He has a normal mood and affect. Judgment normal.  Nursing note and vitals reviewed.    ED Treatments / Results    DIAGNOSTIC STUDIES: Oxygen Saturation is 94% on RA, adequate by my interpretation.    COORDINATION OF CARE: 1:45 AM Discussed treatment plan with pt at bedside and pt agreed to plan.  Labs (all labs ordered are listed, but only abnormal results are displayed) Labs Reviewed  COMPREHENSIVE METABOLIC PANEL - Abnormal; Notable for the following:       Result Value   Potassium 3.0 (*)    Glucose, Bld 114 (*)    AST 42 (*)    All other components within normal limits  CBC - Abnormal; Notable for the following:    RBC 4.02 (*)    MCV 103.2 (*)    MCH 35.3 (*)    All other components within normal limits  URINALYSIS, ROUTINE W REFLEX MICROSCOPIC - Abnormal; Notable for the following:    APPearance HAZY (*)    All other components within normal limits  LIPASE, BLOOD    EKG  EKG Interpretation None       Radiology No results found.  Procedures Procedures (including critical care time)  Medications Ordered in ED Medications - No data to display   Initial Impression / Assessment and Plan / ED Course  I have reviewed the triage vital signs and the nursing notes.  Pertinent labs & imaging results that were available during my care of the patient were reviewed by me and considered in my medical decision making (see chart for details).     Acute pancreatitis  Final Clinical Impressions(s) / ED Diagnoses   Final diagnoses:  Alcohol-induced acute pancreatitis, unspecified complication status    New Prescriptions New Prescriptions   No medications on file  I personally performed the services described in this documentation, which was scribed in my presence. The recorded information has been reviewed and is accurate.        Azalia Bilis, MD 01/26/17 [phone]

## 2017-01-25 NOTE — ED Notes (Signed)
EDP at the bedside.  ?

## 2017-01-25 NOTE — H&P (Signed)
History and Physical    Nicholas ServeJames A Ocain NGE:[id number] DOB: 12/09/1950 DOA: 01/25/2017  Referring MD/NP/PA:   PCP: Jonita AlbeeGuest, Chris W, MD   Patient coming from:  The patient is coming from home.  At baseline, pt is independent for most of ADL.   Chief Complaint: Abdominal pain  HPI: Nicholas Blevins is a 66 y.o. male with medical history significant of alcoholic pancreatitis, tobacco abuse, hypertension, hyperlipidemia, GERD, who presents with abdominal pain.  Patient states that he has been having abdominal pain in the past 3 days, which has been progressively getting worse. The abdominal pain is located in the central abdomen, constant, sharp, 8 out of 10 in severity, nonradiating. It is associated with nausea, but no vomiting or diarrhea. Denies fever or chills. Patient does not have chest pain, SOB, symptoms of UTI or unilateral weakness. Of note, his girlfriend reports that patient has been drinking heavily, at least 6 glasses of liquor each day.  ED Course: pt was found to have lipase >10,000, WBC 7.5, potassium 3.0, creatinine normal, temperature normal, no tachycardia, oxygen saturation 94% on room air, AST 42, ALT normal, total bilirubin normal, ALP 66. Patient is placed on MedSurg bed for observation.  Review of Systems:   General: no fevers, chills, no changes in body weight, has poor appetite, has fatigue HEENT: no blurry vision, hearing changes or sore throat Respiratory: no dyspnea, coughing, wheezing CV: no chest pain, no palpitations GI: has nausea, abdominal pain, no  diarrhea, constipation, vomiting,  GU: no dysuria, burning on urination, increased urinary frequency, hematuria  Ext: no leg edema Neuro: no unilateral weakness, numbness, or tingling, no vision change or hearing loss Skin: no rash, no skin tear. MSK: No muscle spasm, no deformity, no limitation of range of movement in spin Heme: No easy bruising.  Travel history: No recent long distant travel.  Allergy: No  Known Allergies  Past Medical History:  Diagnosis Date  . Alcoholic pancreatitis   . GERD (gastroesophageal reflux disease)   . High cholesterol   . Hypertension   . Tobacco abuse     Past Surgical History:  Procedure Laterality Date  . DENTAL SURGERY      Social History:  reports that he has been smoking.  He has never used smokeless tobacco. He reports that he drinks alcohol. He reports that he does not use drugs.  Family History:  Family History  Problem Relation Age of Onset  . Stroke Mother   . Hypertension Mother   . Hypertension Sister   . Hypertension Brother      Prior to Admission medications   Medication Sig Start Date End Date Taking? Authorizing Provider  aspirin EC 81 MG tablet Take 81 mg by mouth daily.    [provider]  atenolol (TENORMIN) 25 MG tablet Take 25 mg by mouth daily.    [provider]  ferrous sulfate 325 (65 FE) MG tablet Take 325 mg by mouth daily with breakfast.    [provider]  hydrochlorothiazide (HYDRODIURIL) 25 MG tablet Take 25 mg by mouth every morning. 01/14/16   [provider]  levofloxacin (LEVAQUIN) 750 MG tablet Take 1 tablet (750 mg total) by mouth daily. 02/28/16   Leroy SeaSingh, Prashant K, MD  lovastatin (MEVACOR) 20 MG tablet Take 1 tablet (20 mg total) by mouth at bedtime. PATIENT NEEDS OFFICE VISIT/LABS FOR ADDITIONAL REFILLS Patient taking differently: Take 20 mg by mouth at bedtime.  02/23/13   Copland, Gwenlyn FoundJessica C, MD  meloxicam [facility](MOBIC)  15 MG tablet Take 15 mg by mouth daily as needed. For inflammation 01/14/16   [provider]  Multiple Vitamins-Minerals (CENTRUM SILVER 50+MEN) TABS Take 1 tablet by mouth daily with breakfast.    [provider]  pantoprazole (PROTONIX) 40 MG tablet Take 40 mg by mouth daily as needed. For symptoms 01/14/16   [provider]    Physical Exam: Vitals:   01/24/17 2317 01/24/17 2319 01/25/17 0200  BP:  (!) 92/55 131/79  Pulse:  79 72    Resp:  20 19  Temp:  97.6 F (36.4 C)   SpO2:  94% 99%  Weight: 74.4 kg (164 lb)    Height: 5\' 8"  (1.727 m)     General: Not in acute distress HEENT:       Eyes: PERRL, EOMI, no scleral icterus.       ENT: No discharge from the ears and nose, no pharynx injection, no tonsillar enlargement.        Neck: No JVD, no bruit, no mass felt. Heme: No neck lymph node enlargement. Cardiac: S1/S2, RRR, No murmurs, No gallops or rubs. Respiratory: No rales, wheezing, rhonchi or rubs. GI: Soft, nondistended, tenderness in central abdomen, no rebound pain, no organomegaly, BS present. GU: No hematuria Ext: No pitting leg edema bilaterally. 2+DP/PT pulse bilaterally. Musculoskeletal: No joint deformities, No joint redness or warmth, no limitation of ROM in spin. Skin: No rashes.  Neuro: Alert, oriented X3, cranial nerves II-XII grossly intact, moves all extremities normally.  Psych: Patient is not psychotic, no suicidal or hemocidal ideation.  Labs on Admission: I have personally reviewed following labs and imaging studies  CBC:  Recent Labs Lab 01/24/17 2349  WBC 7.5  HGB 14.2  HCT 41.5  MCV 103.2*  PLT 156   Basic Metabolic Panel:  Recent Labs Lab 01/24/17 2349  NA 137  K 3.0*  CL 102  CO2 25  GLUCOSE 114*  BUN 13  CREATININE 0.81  CALCIUM 9.0   GFR: Estimated Creatinine Clearance: 90.3 mL/min (by C-G formula based on SCr of 0.81 mg/dL). Liver Function Tests:  Recent Labs Lab 01/24/17 2349  AST 42*  ALT 44  ALKPHOS 66  BILITOT 0.4  PROT 7.0  ALBUMIN 4.0    Recent Labs Lab 01/24/17 2349  LIPASE >10,000*   No results for input(s): AMMONIA in the last 168 hours. Coagulation Profile: No results for input(s): INR, PROTIME in the last 168 hours. Cardiac Enzymes: No results for input(s): CKTOTAL, CKMB, CKMBINDEX, TROPONINI in the last 168 hours. BNP (last 3 results) No results for input(s): PROBNP in the last 8760 hours. HbA1C: No results for input(s):  HGBA1C in the last 72 hours. CBG: No results for input(s): GLUCAP in the last 168 hours. Lipid Profile: No results for input(s): CHOL, HDL, LDLCALC, TRIG, CHOLHDL, LDLDIRECT in the last 72 hours. Thyroid Function Tests: No results for input(s): TSH, T4TOTAL, FREET4, T3FREE, THYROIDAB in the last 72 hours. Anemia Panel: No results for input(s): VITAMINB12, FOLATE, FERRITIN, TIBC, IRON, RETICCTPCT in the last 72 hours. Urine analysis:    Component Value Date/Time   COLORURINE YELLOW 01/24/2017 2352   APPEARANCEUR HAZY (A) 01/24/2017 2352   LABSPEC 1.018 01/24/2017 2352   PHURINE 5.0 01/24/2017 2352   GLUCOSEU NEGATIVE 01/24/2017 2352   HGBUR NEGATIVE 01/24/2017 2352   BILIRUBINUR NEGATIVE 01/24/2017 2352   KETONESUR NEGATIVE 01/24/2017 2352   PROTEINUR NEGATIVE 01/24/2017 2352   NITRITE NEGATIVE 01/24/2017 2352   LEUKOCYTESUR NEGATIVE 01/24/2017 2352  Sepsis Labs: @LABRCNTIP (procalcitonin:4,lacticidven:4) )No results found for this or any previous visit (from the past 240 hour(s)).   Radiological Exams on Admission: No results found.   EKG:  Not done in ED, will get one.   Assessment/Plan Principal Problem:   Alcoholic pancreatitis Active Problems:   HTN (hypertension)   High cholesterol   Tobacco abuse   Alcohol abuse   Hypokalemia   Alcoholic pancreatitis: Lipase >78,469. Liver function is normal except for mildly elevated AST 42.  His girlfriend reports that patient has been drinking heavily, at least 6 glasses of liquor each day. Pt does not have tachycardia or leukocytosis, clinically dose not have SIRS.  -will place on med-surg bed for observation -NPO for pancreatitis -IVF: 1LNS and then at 200 cc/hr -IV morphine for pain control, IV zofran for nausea -check lipid panel to rule out triglyceridemia.  High cholesterol: -Pravastatin  HTN (hypertension): Blood pressure 92/55 and -Hold HCTZ -Continue atenolol - IV hydralazine when necessary  Hypokalemia:  K=3.0 on admission. - Repleted - Check Mg level  Tobacco abuse and Alcohol abuse: -Did counseling about importance of quitting smoking -Nicotine patch -Did counseling about the importance of quitting drinking -CIWA protocol  DVT ppx: SQ Lovenox Code Status: Full code Family Communication:  Yes, patient's girlfriend at bed side Disposition Plan:  Anticipate discharge back to previous home environment Consults called:  None  Admission status:  medical floor/obs     Date of Service 01/25/2017    Lorretta Harp [facility] Pager [phone]  If 7PM-7AM, please contact night-coverage [email or web address] Password [facility] 01/25/2017, 2:34 AM

## 2017-01-25 NOTE — Progress Notes (Signed)
Patient arrived to the unit via bed from the emergency room. Patient is alert and oriented x 4. Complaints of mid abdominal pain.  Morphine given down in the ED.  Skin assessment complete.  IV intact to the right antecubital area.  Educated the patient on how to reach the staff on the unit. Vital signs stable.  Lowered the bed and placed the call light within reach.  Will continue to monitor the patient and notify as needed.

## 2017-01-26 ENCOUNTER — Inpatient Hospital Stay (HOSPITAL_COMMUNITY): Payer: Managed Care, Other (non HMO)

## 2017-01-26 LAB — COMPREHENSIVE METABOLIC PANEL
ALT: 29 U/L (ref 17–63)
ANION GAP: 10 (ref 5–15)
AST: 27 U/L (ref 15–41)
Albumin: 3.3 g/dL — ABNORMAL LOW (ref 3.5–5.0)
Alkaline Phosphatase: 52 U/L (ref 38–126)
BUN: 13 mg/dL (ref 6–20)
CHLORIDE: 108 mmol/L (ref 101–111)
CO2: 21 mmol/L — AB (ref 22–32)
Calcium: 7.9 mg/dL — ABNORMAL LOW (ref 8.9–10.3)
Creatinine, Ser: 0.78 mg/dL (ref 0.61–1.24)
GFR calc non Af Amer: >60 mL/min (ref >60)
Glucose, Bld: 65 mg/dL (ref 65–99)
Potassium: 3.3 mmol/L — ABNORMAL LOW (ref 3.5–5.1)
SODIUM: 139 mmol/L (ref 135–145)
Total Bilirubin: 0.8 mg/dL (ref 0.3–1.2)
Total Protein: 6 g/dL — ABNORMAL LOW (ref 6.5–8.1)

## 2017-01-26 LAB — CBC
HCT: 34.9 % — ABNORMAL LOW (ref 39.0–52.0)
Hemoglobin: 12 g/dL — ABNORMAL LOW (ref 13.0–17.0)
MCH: 35.6 pg — AB (ref 26.0–34.0)
MCHC: 34.4 g/dL (ref 30.0–36.0)
MCV: 103.6 fL — ABNORMAL HIGH (ref 78.0–100.0)
PLATELETS: 132 10*3/uL — AB (ref 150–400)
RBC: 3.37 MIL/uL — ABNORMAL LOW (ref 4.22–5.81)
RDW: 14.4 % (ref 11.5–15.5)
WBC: 5.8 10*3/uL (ref 4.0–10.5)

## 2017-01-26 LAB — LIPASE, BLOOD: LIPASE: 302 U/L — AB (ref 11–51)

## 2017-01-26 MED ORDER — NICOTINE 21 MG/24HR TD PT24
21.0000 mg | MEDICATED_PATCH | Freq: Every day | TRANSDERMAL | Status: DC
  Administered 2017-01-26 – 2017-01-27 (×2): 21 mg via TRANSDERMAL
  Filled 2017-01-26 (×2): qty 1

## 2017-01-26 MED ORDER — POTASSIUM CHLORIDE CRYS ER 20 MEQ PO TBCR
40.0000 meq | EXTENDED_RELEASE_TABLET | ORAL | Status: AC
  Administered 2017-01-26 (×2): 40 meq via ORAL
  Filled 2017-01-26 (×2): qty 2

## 2017-01-26 MED ORDER — MORPHINE SULFATE (PF) 4 MG/ML IV SOLN: 2.0000 mg | Freq: Four times a day (QID) | INTRAVENOUS | Status: DC | PRN

## 2017-01-26 NOTE — Progress Notes (Signed)
[facility] PROGRESS NOTE  Nicholas ServeJames A Blevins [id number] DOB: 04/18/1951 DOA: 01/25/2017 PCP: Jonita AlbeeGuest, Chris W, MD  Interim summary and HPI 66 y.o.malewith medical history significant of alcoholic pancreatitis, tobacco abuse, hypertension, hyperlipidemia, GERD, who presents with abdominal pain which started after two-week history of alcoholic binge. Was diagnosed with alcohol induced acute pancreatitis in the ER and admitted for further evaluation and management.  Responding well; will advance diet slowly and if stable/toelrating PO regimen, will discharge home in am.  Assessment/Plan: 1-acute pancreatitis: due to alcohol  -abstinence encourage -lipase trending down -symptoms improving -no fever -will advance diet slowly -follow electrolytes -continue PRN analgesics and antiemetics  -continue IVF's  2-hx of dyslipidemia -continue Pravachol -LFT's stable  3-HTN -overall well controlled -will continue atenolol and PRN hydralazine -at discharge will resume HCTZ (increase risk for dehydration and electrolytes abnormalities while on bowel rest)  4-alcohol abuse -cessation counseling and abstinence encouragement provided -resources for AA provided -patient will be kept on PRN CIWA protocol and will continue use of librium  5-tobacco abuse -cessation counseling provided -will continue nicotine patch  6-hypokalemia -repleted -will check Mg and phosphorus level  7-macrocytosis -most likely associated with alcohol abuse -will check B12 level -continue folic acid and thiamine   Code Status: Full Family Communication: no family at bedside  Disposition Plan: will advance diet; switch medications for pain to PO essentially and if tolerated, discharge home in am.   Consultants:  None   Procedures:  See below for x-ray reports   Antibiotics:  None   HPI/Subjective: Afebrile, no CP, no nausea, no vomiting and no abd pain. Patient willing to have diet advance. No  acute signs of withdrawal.   Objective: Vitals:   01/26/17 0854 01/26/17 1305  BP: (!) 170/79 (!) 156/73  Pulse: 83 77  Resp:  18  Temp:  98.3 F (36.8 C)    Intake/Output Summary (Last 24 hours) at 01/26/17 1928 Last data filed at 01/26/17 1305  Gross per 24 hour  Intake          1495.83 ml  Output              750 ml  Net           745.83 ml   Filed Weights   01/24/17 2317 01/25/17 0405  Weight: 74.4 kg (164 lb) 74.8 kg (164 lb 14.5 oz)    Exam:   General: afebrile, feeling much better and denying abd pain, nausea or vomiting.   Cardiovascular: S1 and S2, no rubs, no gallops  Respiratory: good air movement, no wheezing or crackles  Abdomen: soft, mild mid abd discomfort on deep palpation, ND, positive BS  Musculoskeletal: no edema, no cyanosis and no clubbing   Data Reviewed: Basic Metabolic Panel:  Recent Labs Lab 01/24/17 2349 01/25/17 0337 01/26/17 0526  NA 137 138 139  K 3.0* 4.1 3.3*  CL 102 106 108  CO2 25 23 21*  GLUCOSE 114* 115* 65  BUN 13 12 13   CREATININE 0.81 0.77 0.78  CALCIUM 9.0 8.7* 7.9*  MG  --  2.0  --    Liver Function Tests:  Recent Labs Lab 01/24/17 2349 01/25/17 0337 01/26/17 0526  AST 42* 37 27  ALT 44 39 29  ALKPHOS 66 56 52  BILITOT 0.4 0.6 0.8  PROT 7.0 6.6 6.0*  ALBUMIN 4.0 4.0 3.3*    Recent Labs Lab 01/24/17 2349 01/25/17 0337 01/26/17 0526  LIPASE >10,000* 1,870* 302*   CBC:  Recent Labs  Lab 01/24/17 2349 01/25/17 0337 01/26/17 0526  WBC 7.5 6.8 5.8  HGB 14.2 13.8 12.0*  HCT 41.5 40.5 34.9*  MCV 103.2* 103.6* 103.6*  PLT 156 151 132*    Studies: Dg Chest Port 1 View  Result Date: 01/26/2017 CLINICAL DATA:  Shortness of breath, history of hypertension, current smoker. EXAM: PORTABLE CHEST 1 VIEW COMPARISON:  Chest x-ray of February 26, 2016 FINDINGS: The lungs are adequately inflated and clear. The interstitial markings are mildly prominent but stable. The heart and pulmonary vascularity are  normal. The mediastinum is normal in width. The trachea is midline. The bony thorax exhibits no acute abnormality. IMPRESSION: Stable mild interstitial prominence likely reflects the patient's smoking history. No evidence of pneumonia nor CHF. Electronically Signed   By: David  [place] M.D.   On: 01/26/2017 07:30    Scheduled Meds: . aspirin EC  81 mg Oral Daily  . atenolol  25 mg Oral Daily  . chlordiazePOXIDE  10 mg Oral TID  . enoxaparin (LOVENOX) injection  40 mg Subcutaneous Q24H  . ferrous sulfate  325 mg Oral Q breakfast  . folic acid  1 mg Oral Daily  . LORazepam  0-4 mg Intravenous Q6H   Followed by  . [START ON 01/27/2017] LORazepam  0-4 mg Intravenous Q12H  . multivitamin with minerals  1 tablet Oral Daily  . nicotine  21 mg Transdermal Daily  . pantoprazole  40 mg Oral Daily  . pravastatin  20 mg Oral q1800  . thiamine  100 mg Oral Daily   Or  . thiamine  100 mg Intravenous Daily   Continuous Infusions: . sodium chloride       Time spent: 25 minutes    Vassie Loll  [facility] Pager [phone]. If 7PM-7AM, please contact night-coverage at [email or web address], password [facility] 01/26/2017, 7:28 PM  LOS: 1 day

## 2017-01-27 LAB — PHOSPHORUS: Phosphorus: 1.7 mg/dL — ABNORMAL LOW (ref 2.5–4.6)

## 2017-01-27 LAB — BASIC METABOLIC PANEL
ANION GAP: 8 (ref 5–15)
BUN: 6 mg/dL (ref 6–20)
CO2: 23 mmol/L (ref 22–32)
Calcium: 8.2 mg/dL — ABNORMAL LOW (ref 8.9–10.3)
Chloride: 106 mmol/L (ref 101–111)
Creatinine, Ser: 0.75 mg/dL (ref 0.61–1.24)
GFR calc Af Amer: >60 mL/min (ref >60)
GLUCOSE: 78 mg/dL (ref 65–99)
POTASSIUM: 3.4 mmol/L — AB (ref 3.5–5.1)
Sodium: 137 mmol/L (ref 135–145)

## 2017-01-27 LAB — LIPASE, BLOOD: LIPASE: 85 U/L — AB (ref 11–51)

## 2017-01-27 LAB — MAGNESIUM: Magnesium: 2 mg/dL (ref 1.7–2.4)

## 2017-01-27 LAB — VITAMIN B12: VITAMIN B 12: 637 pg/mL (ref 180–914)

## 2017-01-27 MED ORDER — CHLORDIAZEPOXIDE HCL 10 MG PO CAPS: ORAL_CAPSULE | ORAL | 0 refills | Status: AC

## 2017-01-27 MED ORDER — POTASSIUM PHOSPHATE MONOBASIC 500 MG PO TABS: 500.0000 mg | ORAL_TABLET | Freq: Two times a day (BID) | ORAL | 0 refills | Status: DC

## 2017-01-27 MED ORDER — NICOTINE 21 MG/24HR TD PT24: 21.0000 mg | MEDICATED_PATCH | Freq: Every day | TRANSDERMAL | 0 refills | Status: DC

## 2017-01-27 NOTE — Progress Notes (Signed)
Nsg Discharge Note  Admit Date:  01/25/2017 Discharge date: 01/27/2017   Delena ServeJames A Liaw to be D/C'd Home per MD order.  AVS completed.  Copy for chart, and copy for patient signed, and dated. Patient/caregiver able to verbalize understanding.  Discharge Assessment: Vitals:   01/27/17 0517 01/27/17 0906  BP: (!) 146/78 (!) 157/83  Pulse: 77 82  Resp: 18   Temp: 98.8 F (37.1 C)    Skin clean, dry and intact without evidence of skin break down, no evidence of skin tears noted. IV catheter discontinued intact. Site without signs and symptoms of complications - no redness or edema noted at insertion site, patient denies c/o pain - only slight tenderness at site.  Dressing with slight pressure applied.  D/c Instructions-Education: Discharge instructions given to patient/family with verbalized understanding. D/c education completed with patient/family including follow up instructions, medication list, d/c activities limitations if indicated, with other d/c instructions as indicated by MD - patient able to verbalize understanding, all questions fully answered. Patient instructed to return to ED, call 911, or call MD for any changes in condition.  Patient escorted via WC, and D/C home via private auto.

## 2017-01-27 NOTE — Discharge Summary (Signed)
Physician Discharge Summary  Nicholas Blevins ZOX:[id number] DOB: 1950/09/09 DOA: 01/25/2017  PCP: Jonita Albee, MD  Admit date: 01/25/2017 Discharge date: 01/27/2017  Time spent: 35 minutes  Recommendations for Outpatient Follow-up:  Repeat CMET to follow electrolytes, renal function and LFT's Assist patient as needed with tobacco and alcohol cessation Reassess BP and adjust medications as needed   Discharge Diagnoses:  Principal Problem:   Alcoholic pancreatitis Active Problems:   HTN (hypertension)   High cholesterol   Tobacco abuse   Alcohol abuse   Hypokalemia   Acute alcoholic pancreatitis   Discharge Condition: stable and improved. Discharge home, with instructions to follow up with PCP after discharge.  Diet recommendation: heart healthy/low fat diet   Filed Weights   01/24/17 2317 01/25/17 0405  Weight: 74.4 kg (164 lb) 74.8 kg (164 lb 14.5 oz)    Brief History of present illness:  66 y.o.malewith medical history significant of alcoholic pancreatitis, tobacco abuse, hypertension, hyperlipidemia, GERD, who presents with abdominal pain which started after two-week history of alcoholic binge. Was diagnosed with alcohol induced acute pancreatitis in the ER and admitted for further evaluation and management.  Hospital Course:  1-acute pancreatitis: due to alcohol  -abstinence encourage -lipase trended/essentially resolved at discharge -no fever, no nausea, no vomiting, no significant abd pain and tolerating PO's (low fat diet) -continue PRN analgesics (tylenol at discharge) -advise to maintain adequate hydration   2-hx of dyslipidemia -continue Pravachol -LFT's stable; will recommend follow up of levels at visit with PCP  3-HTN -overall well controlled -will continue atenolol and resume home HCTZ -patient advise to follow heart healthy diet   4-alcohol abuse -cessation counseling and abstinence encouragement provided -resources for AA provided -patient  kept on PRN CIWA protocol while inpatient and discharge on tapering librium therapy  5-tobacco abuse -cessation counseling provided -will discharge on nicotine patch  6-hypokalemia/low phosphorus -associated with alcohol use most likely  -patient electrolytes acutely repleted; discharge on extra daily maintenance supplementation. -will recommend repeat blood work at follow up to reassess electrolytes abnormalities  7-macrocytosis -most likely associated with alcohol abuse -B12 level WNL -continue folic acid and thiamine supplementation -alcohol cessation encourage  Procedures:  See below for x-ray reports   Consultations:  None   Discharge Exam: Vitals:   01/27/17 0517 01/27/17 0906  BP: (!) 146/78 (!) 157/83  Pulse: 77 82  Resp: 18   Temp: 98.8 F (37.1 C)      General: afebrile, feeling much better and denying significant abd pain, nausea or vomiting. Patient tolerated low fat diet and PO meds. Will like to go home.  Cardiovascular: S1 and S2, no rubs, no gallops  Respiratory: good air movement, no wheezing or crackles  Abdomen: soft, mild mid abd discomfort on deep palpation, ND, positive BS  Musculoskeletal: no edema, no cyanosis and no clubbing    Discharge Instructions   Discharge Instructions    Diet - low sodium heart healthy    Complete by:  As directed    Discharge instructions    Complete by:  As directed    Take medications as prescribed Please follow low fat diet Arrange follow up with PCP in 2 weeks Please stop smoking and stop alcohol consumption     Current Discharge Medication List    START taking these medications   Details  chlordiazePOXIDE (LIBRIUM) 10 MG capsule Take 1 tablet three times a day for 2 days; then 1 tablet twice a day for 3 days; then 1 tablet by mouth  daily for 4 days and stop medication. Qty: 16 capsule, Refills: 0    nicotine (NICODERM CQ - DOSED IN MG/24 HOURS) 21 mg/24hr patch Place 1 patch (21 mg total)  onto the skin daily. Qty: 28 patch, Refills: 0    potassium phosphate, monobasic, (K-PHOS) 500 MG tablet Take 1 tablet (500 mg total) by mouth 2 (two) times daily with a meal. Qty: 10 tablet, Refills: 0      CONTINUE these medications which have NOT CHANGED   Details  ALPRAZolam (XANAX) 0.25 MG tablet Take 0.25 mg by mouth 2 (two) times daily as needed for anxiety.    aspirin EC 81 MG tablet Take 81 mg by mouth daily.    atenolol (TENORMIN) 25 MG tablet Take 25 mg by mouth daily.    hydrochlorothiazide (HYDRODIURIL) 25 MG tablet Take 25 mg by mouth every morning.    lovastatin (MEVACOR) 20 MG tablet Take 1 tablet (20 mg total) by mouth at bedtime. PATIENT NEEDS OFFICE VISIT/LABS FOR ADDITIONAL REFILLS Qty: 90 tablet, Refills: 0    Multiple Vitamins-Minerals (CENTRUM SILVER 50+MEN) TABS Take 1 tablet by mouth daily with breakfast.    pantoprazole (PROTONIX) 40 MG tablet Take 40 mg by mouth daily. For symptoms      STOP taking these medications     meloxicam (MOBIC) 15 MG tablet      ferrous sulfate 325 (65 FE) MG tablet        No Known Allergies Follow-up Information    Jonita Albee, MD Follow up in 2 week(s).   Specialty:  Internal Medicine           The results of significant diagnostics from this hospitalization (including imaging, microbiology, ancillary and laboratory) are listed below for reference.    Significant Diagnostic Studies: Dg Chest Port 1 View  Result Date: 01/26/2017 CLINICAL DATA:  Shortness of breath, history of hypertension, current smoker. EXAM: PORTABLE CHEST 1 VIEW COMPARISON:  Chest x-ray of February 26, 2016 FINDINGS: The lungs are adequately inflated and clear. The interstitial markings are mildly prominent but stable. The heart and pulmonary vascularity are normal. The mediastinum is normal in width. The trachea is midline. The bony thorax exhibits no acute abnormality. IMPRESSION: Stable mild interstitial prominence likely reflects the  patient's smoking history. No evidence of pneumonia nor CHF. Electronically Signed   By: David  [place] M.D.   On: 01/26/2017 07:30    Microbiology: No results found for this or any previous visit (from the past 240 hour(s)).   Labs: Basic Metabolic Panel:  Recent Labs Lab 01/24/17 2349 01/25/17 0337 01/26/17 0526 01/27/17 0508  NA 137 138 139 137  K 3.0* 4.1 3.3* 3.4*  CL 102 106 108 106  CO2 25 23 21* 23  GLUCOSE 114* 115* 65 78  BUN 13 12 13 6   CREATININE 0.81 0.77 0.78 0.75  CALCIUM 9.0 8.7* 7.9* 8.2*  MG  --  2.0  --  2.0  PHOS  --   --   --  1.7*   Liver Function Tests:  Recent Labs Lab 01/24/17 2349 01/25/17 0337 01/26/17 0526  AST 42* 37 27  ALT 44 39 29  ALKPHOS 66 56 52  BILITOT 0.4 0.6 0.8  PROT 7.0 6.6 6.0*  ALBUMIN 4.0 4.0 3.3*    Recent Labs Lab 01/24/17 2349 01/25/17 0337 01/26/17 0526 01/27/17 0508  LIPASE >10,000* 1,870* 302* 85*   No results for input(s): AMMONIA in the last 168 hours. CBC:  Recent Labs Lab  01/24/17 2349 01/25/17 0337 01/26/17 0526  WBC 7.5 6.8 5.8  HGB 14.2 13.8 12.0*  HCT 41.5 40.5 34.9*  MCV 103.2* 103.6* 103.6*  PLT 156 151 132*   Cardiac Enzymes: No results for input(s): CKTOTAL, CKMB, CKMBINDEX, TROPONINI in the last 168 hours. BNP: BNP (last 3 results) No results for input(s): BNP in the last 8760 hours.  ProBNP (last 3 results) No results for input(s): PROBNP in the last 8760 hours.  CBG: No results for input(s): GLUCAP in the last 168 hours.     Signed:  Vassie LollMadera, Kooper Godshall MD.  [facility] 01/27/2017, 1:32 PM

## 2018-11-12 ENCOUNTER — Emergency Department (HOSPITAL_COMMUNITY)
Admission: EM | Admit: 2018-11-12 | Discharge: 2018-11-12 | Disposition: A | Discharge status: Home / Self Care | Payer: Medicare Other | Attending: Emergency Medicine | Admitting: Emergency Medicine

## 2018-11-12 ENCOUNTER — Emergency Department (HOSPITAL_COMMUNITY): Payer: Medicare Other

## 2018-11-12 ENCOUNTER — Other Ambulatory Visit: Payer: Self-pay

## 2018-11-12 DIAGNOSIS — J0110 Acute frontal sinusitis, unspecified: Secondary | ICD-10-CM | POA: Diagnosis not present

## 2018-11-12 DIAGNOSIS — Z7982 Long term (current) use of aspirin: Secondary | ICD-10-CM | POA: Diagnosis not present

## 2018-11-12 DIAGNOSIS — I10 Essential (primary) hypertension: Secondary | ICD-10-CM | POA: Diagnosis not present

## 2018-11-12 DIAGNOSIS — R000 Tachycardia, unspecified: Secondary | ICD-10-CM | POA: Diagnosis not present

## 2018-11-12 DIAGNOSIS — Z79899 Other long term (current) drug therapy: Secondary | ICD-10-CM | POA: Insufficient documentation

## 2018-11-12 DIAGNOSIS — F17200 Nicotine dependence, unspecified, uncomplicated: Secondary | ICD-10-CM | POA: Diagnosis not present

## 2018-11-12 DIAGNOSIS — R0981 Nasal congestion: Secondary | ICD-10-CM | POA: Diagnosis present

## 2018-11-12 LAB — COMPREHENSIVE METABOLIC PANEL
ALT: 54 U/L — ABNORMAL HIGH (ref 0–44)
AST: 54 U/L — ABNORMAL HIGH (ref 15–41)
Albumin: 4.5 g/dL (ref 3.5–5.0)
Alkaline Phosphatase: 86 U/L (ref 38–126)
Anion gap: 15 (ref 5–15)
BUN: 12 mg/dL (ref 8–23)
CO2: 20 mmol/L — ABNORMAL LOW (ref 22–32)
Calcium: 10.1 mg/dL (ref 8.9–10.3)
Chloride: 95 mmol/L — ABNORMAL LOW (ref 98–111)
Creatinine, Ser: 1.02 mg/dL (ref 0.61–1.24)
GFR calc Af Amer: >60 mL/min (ref >60)
GFR calc non Af Amer: >60 mL/min (ref >60)
Glucose, Bld: 112 mg/dL — ABNORMAL HIGH (ref 70–99)
Potassium: 4.1 mmol/L (ref 3.5–5.1)
Sodium: 130 mmol/L — ABNORMAL LOW (ref 135–145)
Total Bilirubin: 0.8 mg/dL (ref 0.3–1.2)
Total Protein: 7.7 g/dL (ref 6.5–8.1)

## 2018-11-12 LAB — CBC WITH DIFFERENTIAL/PLATELET
Abs Immature Granulocytes: 0.02 10*3/uL (ref 0.00–0.07)
Basophils Absolute: 0 10*3/uL (ref 0.0–0.1)
Basophils Relative: 0 %
Eosinophils Absolute: 0.2 10*3/uL (ref 0.0–0.5)
Eosinophils Relative: 3 %
HCT: 38.7 % — ABNORMAL LOW (ref 39.0–52.0)
Hemoglobin: 13.9 g/dL (ref 13.0–17.0)
Immature Granulocytes: 0 %
Lymphocytes Relative: 32 %
Lymphs Abs: 1.9 10*3/uL (ref 0.7–4.0)
MCH: 34.5 pg — ABNORMAL HIGH (ref 26.0–34.0)
MCHC: 35.9 g/dL (ref 30.0–36.0)
MCV: 96 fL (ref 80.0–100.0)
Monocytes Absolute: 0.9 10*3/uL (ref 0.1–1.0)
Monocytes Relative: 15 %
Neutro Abs: 3 10*3/uL (ref 1.7–7.7)
Neutrophils Relative %: 50 %
Platelets: 221 10*3/uL (ref 150–400)
RBC: 4.03 MIL/uL — ABNORMAL LOW (ref 4.22–5.81)
RDW: 14.7 % (ref 11.5–15.5)
WBC: 5.9 10*3/uL (ref 4.0–10.5)
nRBC: 0.3 % — ABNORMAL HIGH (ref 0.0–0.2)

## 2018-11-12 MED ORDER — DOXYCYCLINE HYCLATE 100 MG PO CAPS: 100.0000 mg | ORAL_CAPSULE | Freq: Two times a day (BID) | ORAL | 0 refills | Status: AC

## 2018-11-12 NOTE — ED Triage Notes (Signed)
Pt here for nasal congestion since last Monday, pt sts he thinks its allergies. Taking Claritin without relief. Denies pain.

## 2018-11-12 NOTE — ED Provider Notes (Signed)
[facility] EMERGENCY DEPARTMENT Provider Note   CSN: [id number] Arrival date & time: 11/12/18  1844    History   Chief Complaint Chief Complaint  Patient presents with  . Allergies    HPI Nicholas Blevins is a 68 y.o. male.     HPI 68 year old man with a past medical history see below presents to the emergency department for 1 week of nasal congestion.  States that he has been taking intermittent Claritin at home without relief.  Denies any fevers, changes in bowel or bladder habits, changes in appetite.  Denies any shortness of breath or chest pain.  Otherwise has been in his normal state of health.  Denies any sick contacts.  Denies a sore throat or cough.  Greatest complaint is with his runny nose.  I spoke to the wife over the phone who stated that the patient did complain of shortness of breath earlier last week approximately 1 week ago at this point.  He denies this and states that he has not had any shortness of breath.  No fevers at home.  No other medications tried at home. Past Medical History:  Diagnosis Date  . Alcoholic pancreatitis   . GERD (gastroesophageal reflux disease)   . High cholesterol   . Hypertension   . Tobacco abuse     Patient Active Problem List   Diagnosis Date Noted  . Tobacco abuse 01/25/2017  . Alcohol abuse 01/25/2017  . Hypokalemia 01/25/2017  . Alcoholic pancreatitis 01/25/2017  . Acute alcoholic pancreatitis 01/25/2017  . HTN (hypertension) 12/19/2012  . High cholesterol 12/19/2012    Past Surgical History:  Procedure Laterality Date  . DENTAL SURGERY          Home Medications    Prior to Admission medications   Medication Sig Start Date End Date Taking? Authorizing Provider  ALPRAZolam (XANAX) 0.25 MG tablet Take 0.25 mg by mouth 2 (two) times daily as needed for anxiety.    [provider]  aspirin EC 81 MG tablet Take 81 mg by mouth daily.    [provider]  atenolol (TENORMIN) 25 MG  tablet Take 25 mg by mouth daily.    [provider]  chlordiazePOXIDE (LIBRIUM) 10 MG capsule Take 1 tablet three times a day for 2 days; then 1 tablet twice a day for 3 days; then 1 tablet by mouth daily for 4 days and stop medication. 01/27/17   Vassie Loll, MD  hydrochlorothiazide (HYDRODIURIL) 25 MG tablet Take 25 mg by mouth every morning. 01/14/16   [provider]  lovastatin (MEVACOR) 20 MG tablet Take 1 tablet (20 mg total) by mouth at bedtime. PATIENT NEEDS OFFICE VISIT/LABS FOR ADDITIONAL REFILLS Patient taking differently: Take 40 mg by mouth at bedtime.  02/23/13   Copland, Gwenlyn Found, MD  Multiple Vitamins-Minerals (CENTRUM SILVER 50+MEN) TABS Take 1 tablet by mouth daily with breakfast.    [provider]  nicotine (NICODERM CQ - DOSED IN MG/24 HOURS) 21 mg/24hr patch Place 1 patch (21 mg total) onto the skin daily. 01/28/17   Vassie Loll, MD  pantoprazole (PROTONIX) 40 MG tablet Take 40 mg by mouth daily. For symptoms 01/14/16   [provider]  potassium phosphate, monobasic, (K-PHOS) 500 MG tablet Take 1 tablet (500 mg total) by mouth 2 (two) times daily with a meal. 01/27/17   Vassie Loll, MD    Family History Family History  Problem Relation Age of Onset  . Stroke Mother   .  Hypertension Mother   . Hypertension Sister   . Hypertension Brother     Social History Social History   Tobacco Use  . Smoking status: Current Every Day Smoker  . Smokeless tobacco: Never Used  Substance Use Topics  . Alcohol use: Yes  . Drug use: No     Allergies   Patient has no known allergies.   Review of Systems Review of Systems  Constitutional: Negative for chills and fever.  HENT: Positive for congestion and rhinorrhea. Negative for ear pain and sore throat.   Eyes: Negative for pain and visual disturbance.  Respiratory: Negative for cough.   Cardiovascular: Negative for chest pain and palpitations.  Gastrointestinal: Negative for  abdominal pain and vomiting.  Genitourinary: Negative for dysuria and hematuria.  Musculoskeletal: Negative for arthralgias and back pain.  Skin: Negative for color change and rash.  Neurological: Negative for seizures and syncope.  All other systems reviewed and are negative.    Physical Exam Updated Vital Signs BP 136/84 (BP Location: Right Arm)   Pulse (!) 105   Temp 98.3 F (36.8 C) (Oral)   Resp 16   Ht 5\' 8"  (1.727 m)   Wt 75.8 kg   SpO2 100%   BMI 25.39 kg/m   Physical Exam Vitals signs and nursing note reviewed.  Constitutional:      General: He is not in acute distress.    Appearance: He is well-developed. He is not ill-appearing or diaphoretic.  HENT:     Head: Normocephalic and atraumatic.     Nose: Congestion and rhinorrhea present.     Mouth/Throat:     Pharynx: Posterior oropharyngeal erythema present. No oropharyngeal exudate.  Eyes:     Conjunctiva/sclera: Conjunctivae normal.  Neck:     Musculoskeletal: Normal range of motion and neck supple. No muscular tenderness.  Cardiovascular:     Rate and Rhythm: Normal rate and regular rhythm.     Heart sounds: No murmur.  Pulmonary:     Effort: Pulmonary effort is normal. No respiratory distress.     Breath sounds: Normal breath sounds.  Abdominal:     General: Abdomen is flat.     Palpations: Abdomen is soft.     Tenderness: There is no abdominal tenderness.  Musculoskeletal:        General: No deformity or signs of injury.     Right lower leg: No edema.     Left lower leg: No edema.  Skin:    General: Skin is warm and dry.     Capillary Refill: Capillary refill takes less than 2 seconds.     Findings: No lesion or rash.  Neurological:     General: No focal deficit present.     Mental Status: He is alert and oriented to person, place, and time.      ED Treatments / Results  Labs (all labs ordered are listed, but only abnormal results are displayed) Labs Reviewed - No data to display  EKG  None  Radiology No results found.  Procedures Procedures (including critical care time)  Medications Ordered in ED Medications - No data to display   Initial Impression / Assessment and Plan / ED Course  I have reviewed the triage vital signs and the nursing notes.  Pertinent labs & imaging results that were available during my care of the patient were reviewed by me and considered in my medical decision making (see chart for details).         Well-appearing hemodynamically  stable 68 year old man presents to the emergency department for 1 week of nasal congestion.  Found to be tachycardic on initial examination and EKG reveals a sinus tachycardia without any ST segment abnormalities or T wave abnormalities to suggest ischemia at this time.  He does not have any complaints of chest pain or shortness of breath.  No history of cardiac disease.  Lab work does not show anemia and otherwise appears to be unremarkable.  Chest x-ray unremarkable.  Do not feel that this is consistent with ACS given his lack of previous cardiac history as well as his lack of chest pain or shortness of breath.  Therefore troponin not sent.  Unlikely to be PE as he does not have signs of a DVT nor does he have risk factors for them and otherwise denies chest pain or shortness of breath.  Treated with doxycycline outpatient for acute uncomplicated sinusitis. Final Clinical Impressions(s) / ED Diagnoses   Final diagnoses:  Acute non-recurrent frontal sinusitis    ED Discharge Orders    None       Ina KickWestphal, Taina Landry, MD 11/12/18 2048    Melene PlanFloyd, Dan, DO 11/12/18 2056

## 2018-11-12 NOTE — ED Notes (Signed)
X ray in room.

## 2018-11-12 NOTE — ED Notes (Signed)
Pt's sps. Matilde Bash [phone]. Pls contact with status update & D/C

## 2018-11-12 NOTE — ED Notes (Signed)
Patient verbalizes understanding of discharge instructions. Opportunity for questioning and answers were provided. Armband removed by staff, pt discharged from ED ambulatory. Signature pad unavailable to work

## 2019-01-02 ENCOUNTER — Other Ambulatory Visit: Payer: Self-pay

## 2019-01-02 ENCOUNTER — Emergency Department (HOSPITAL_COMMUNITY): Payer: Medicare Other

## 2019-01-02 ENCOUNTER — Encounter (HOSPITAL_COMMUNITY): Payer: Self-pay | Admitting: Emergency Medicine

## 2019-01-02 ENCOUNTER — Inpatient Hospital Stay (HOSPITAL_COMMUNITY): Payer: Medicare Other

## 2019-01-02 ENCOUNTER — Inpatient Hospital Stay (HOSPITAL_COMMUNITY)
Admission: EM | Admit: 2019-01-02 | Discharge: 2019-01-04 | DRG: 440 | Disposition: A | Discharge status: Home / Self Care | Payer: Medicare Other | Attending: Internal Medicine | Admitting: Internal Medicine

## 2019-01-02 DIAGNOSIS — E860 Dehydration: Secondary | ICD-10-CM | POA: Diagnosis present

## 2019-01-02 DIAGNOSIS — I2510 Atherosclerotic heart disease of native coronary artery without angina pectoris: Secondary | ICD-10-CM | POA: Diagnosis present

## 2019-01-02 DIAGNOSIS — F17210 Nicotine dependence, cigarettes, uncomplicated: Secondary | ICD-10-CM | POA: Diagnosis present

## 2019-01-02 DIAGNOSIS — F1290 Cannabis use, unspecified, uncomplicated: Secondary | ICD-10-CM | POA: Diagnosis present

## 2019-01-02 DIAGNOSIS — Z79899 Other long term (current) drug therapy: Secondary | ICD-10-CM

## 2019-01-02 DIAGNOSIS — Z888 Allergy status to other drugs, medicaments and biological substances status: Secondary | ICD-10-CM

## 2019-01-02 DIAGNOSIS — E785 Hyperlipidemia, unspecified: Secondary | ICD-10-CM

## 2019-01-02 DIAGNOSIS — Z7289 Other problems related to lifestyle: Secondary | ICD-10-CM

## 2019-01-02 DIAGNOSIS — Z7982 Long term (current) use of aspirin: Secondary | ICD-10-CM

## 2019-01-02 DIAGNOSIS — I10 Essential (primary) hypertension: Secondary | ICD-10-CM | POA: Diagnosis present

## 2019-01-02 DIAGNOSIS — F1010 Alcohol abuse, uncomplicated: Secondary | ICD-10-CM | POA: Diagnosis present

## 2019-01-02 DIAGNOSIS — R740 Nonspecific elevation of levels of transaminase and lactic acid dehydrogenase [LDH]: Secondary | ICD-10-CM | POA: Diagnosis not present

## 2019-01-02 DIAGNOSIS — K852 Alcohol induced acute pancreatitis without necrosis or infection: Secondary | ICD-10-CM | POA: Diagnosis present

## 2019-01-02 DIAGNOSIS — K859 Acute pancreatitis without necrosis or infection, unspecified: Secondary | ICD-10-CM | POA: Diagnosis present

## 2019-01-02 DIAGNOSIS — K760 Fatty (change of) liver, not elsewhere classified: Secondary | ICD-10-CM | POA: Diagnosis present

## 2019-01-02 DIAGNOSIS — Z20828 Contact with and (suspected) exposure to other viral communicable diseases: Secondary | ICD-10-CM | POA: Diagnosis present

## 2019-01-02 DIAGNOSIS — K219 Gastro-esophageal reflux disease without esophagitis: Secondary | ICD-10-CM | POA: Diagnosis present

## 2019-01-02 DIAGNOSIS — Z8249 Family history of ischemic heart disease and other diseases of the circulatory system: Secondary | ICD-10-CM

## 2019-01-02 LAB — LIPID PANEL
Cholesterol: 153 mg/dL (ref 0–200)
HDL: 42 mg/dL (ref >40)
LDL Cholesterol: 70 mg/dL (ref 0–99)
Total CHOL/HDL Ratio: 3.6 RATIO
Triglycerides: 205 mg/dL — ABNORMAL HIGH (ref <150)
VLDL: 41 mg/dL — ABNORMAL HIGH (ref 0–40)

## 2019-01-02 LAB — COMPREHENSIVE METABOLIC PANEL
ALT: 51 U/L — ABNORMAL HIGH (ref 0–44)
AST: 44 U/L — ABNORMAL HIGH (ref 15–41)
Albumin: 4.3 g/dL (ref 3.5–5.0)
Alkaline Phosphatase: 98 U/L (ref 38–126)
Anion gap: 14 (ref 5–15)
BUN: 12 mg/dL (ref 8–23)
CO2: 23 mmol/L (ref 22–32)
Calcium: 9.8 mg/dL (ref 8.9–10.3)
Chloride: 96 mmol/L — ABNORMAL LOW (ref 98–111)
Creatinine, Ser: 0.93 mg/dL (ref 0.61–1.24)
GFR calc Af Amer: >60 mL/min (ref >60)
GFR calc non Af Amer: >60 mL/min (ref >60)
Glucose, Bld: 100 mg/dL — ABNORMAL HIGH (ref 70–99)
Potassium: 3.4 mmol/L — ABNORMAL LOW (ref 3.5–5.1)
Sodium: 133 mmol/L — ABNORMAL LOW (ref 135–145)
Total Bilirubin: 0.9 mg/dL (ref 0.3–1.2)
Total Protein: 7.9 g/dL (ref 6.5–8.1)

## 2019-01-02 LAB — ETHANOL: Alcohol, Ethyl (B): <10 mg/dL (ref <10)

## 2019-01-02 LAB — CBC
HCT: 41.7 % (ref 39.0–52.0)
Hemoglobin: 15 g/dL (ref 13.0–17.0)
MCH: 36.1 pg — ABNORMAL HIGH (ref 26.0–34.0)
MCHC: 36 g/dL (ref 30.0–36.0)
MCV: 100.2 fL — ABNORMAL HIGH (ref 80.0–100.0)
Platelets: 226 10*3/uL (ref 150–400)
RBC: 4.16 MIL/uL — ABNORMAL LOW (ref 4.22–5.81)
RDW: 15.6 % — ABNORMAL HIGH (ref 11.5–15.5)
WBC: 7 10*3/uL (ref 4.0–10.5)
nRBC: 0.4 % — ABNORMAL HIGH (ref 0.0–0.2)

## 2019-01-02 LAB — URINALYSIS, ROUTINE W REFLEX MICROSCOPIC
Bilirubin Urine: NEGATIVE
Glucose, UA: NEGATIVE mg/dL
Hgb urine dipstick: NEGATIVE
Ketones, ur: 20 mg/dL — AB
Leukocytes,Ua: NEGATIVE
Nitrite: NEGATIVE
Protein, ur: NEGATIVE mg/dL
Specific Gravity, Urine: >1.046 — ABNORMAL HIGH (ref 1.005–1.030)
pH: 6 (ref 5.0–8.0)

## 2019-01-02 LAB — ACETAMINOPHEN LEVEL: Acetaminophen (Tylenol), Serum: <10 ug/mL — ABNORMAL LOW (ref 10–30)

## 2019-01-02 LAB — SARS CORONAVIRUS 2: SARS Coronavirus 2: NOT DETECTED

## 2019-01-02 LAB — LIPASE, BLOOD: Lipase: 807 U/L — ABNORMAL HIGH (ref 11–51)

## 2019-01-02 MED ORDER — FOLIC ACID 5 MG/ML IJ SOLN
1.0000 mg | Freq: Every day | INTRAMUSCULAR | Status: DC
  Administered 2019-01-03: 1 mg via INTRAVENOUS
  Filled 2019-01-02 (×3): qty 0.2

## 2019-01-02 MED ORDER — ACETAMINOPHEN 650 MG RE SUPP: 650.0000 mg | Freq: Four times a day (QID) | RECTAL | Status: DC | PRN

## 2019-01-02 MED ORDER — ACETAMINOPHEN 325 MG PO TABS: 650.0000 mg | ORAL_TABLET | Freq: Four times a day (QID) | ORAL | Status: DC | PRN

## 2019-01-02 MED ORDER — SODIUM CHLORIDE 0.9 % IV SOLN: INTRAVENOUS | Status: DC

## 2019-01-02 MED ORDER — ENOXAPARIN SODIUM 40 MG/0.4ML ~~LOC~~ SOLN
40.0000 mg | SUBCUTANEOUS | Status: DC
  Administered 2019-01-02 – 2019-01-03 (×2): 40 mg via SUBCUTANEOUS
  Filled 2019-01-02 (×2): qty 0.4

## 2019-01-02 MED ORDER — IOHEXOL 300 MG/ML  SOLN
100.0000 mL | Freq: Once | INTRAMUSCULAR | Status: AC | PRN
  Administered 2019-01-02: 100 mL via INTRAVENOUS

## 2019-01-02 MED ORDER — SENNOSIDES-DOCUSATE SODIUM 8.6-50 MG PO TABS: 1.0000 | ORAL_TABLET | Freq: Every evening | ORAL | Status: DC | PRN

## 2019-01-02 MED ORDER — THIAMINE HCL 100 MG/ML IJ SOLN
100.0000 mg | Freq: Every day | INTRAMUSCULAR | Status: DC
  Administered 2019-01-02 – 2019-01-04 (×3): 100 mg via INTRAVENOUS
  Filled 2019-01-02 (×3): qty 2

## 2019-01-02 MED ORDER — POTASSIUM CHLORIDE IN NACL 20-0.9 MEQ/L-% IV SOLN
INTRAVENOUS | Status: DC
  Administered 2019-01-02 – 2019-01-03 (×3): via INTRAVENOUS
  Filled 2019-01-02 (×3): qty 1000

## 2019-01-02 MED ORDER — LORAZEPAM 2 MG/ML IJ SOLN: 1.0000 mg | INTRAMUSCULAR | Status: DC | PRN

## 2019-01-02 MED ORDER — NICOTINE 14 MG/24HR TD PT24
14.0000 mg | MEDICATED_PATCH | Freq: Every day | TRANSDERMAL | Status: DC
  Administered 2019-01-02 – 2019-01-04 (×3): 14 mg via TRANSDERMAL
  Filled 2019-01-02 (×3): qty 1

## 2019-01-02 MED ORDER — LORAZEPAM 1 MG PO TABS: 1.0000 mg | ORAL_TABLET | ORAL | Status: DC | PRN

## 2019-01-02 MED ORDER — SODIUM CHLORIDE 0.9 % IV BOLUS
2000.0000 mL | Freq: Once | INTRAVENOUS | Status: AC
  Administered 2019-01-02: 2000 mL via INTRAVENOUS

## 2019-01-02 MED ORDER — ONDANSETRON HCL 4 MG/2ML IJ SOLN
4.0000 mg | Freq: Once | INTRAMUSCULAR | Status: AC
  Administered 2019-01-02: 4 mg via INTRAVENOUS
  Filled 2019-01-02: qty 2

## 2019-01-02 MED ORDER — HYDROMORPHONE HCL 1 MG/ML IJ SOLN
0.5000 mg | INTRAMUSCULAR | Status: DC | PRN
  Administered 2019-01-02 – 2019-01-03 (×2): 0.5 mg via INTRAVENOUS
  Filled 2019-01-02 (×3): qty 1

## 2019-01-02 MED ORDER — ONDANSETRON HCL 4 MG/2ML IJ SOLN: 4.0000 mg | Freq: Four times a day (QID) | INTRAMUSCULAR | Status: DC | PRN

## 2019-01-02 MED ORDER — ALPRAZOLAM 0.5 MG PO TABS: 0.5000 mg | ORAL_TABLET | Freq: Two times a day (BID) | ORAL | Status: DC | PRN

## 2019-01-02 MED ORDER — MORPHINE SULFATE (PF) 4 MG/ML IV SOLN
4.0000 mg | Freq: Once | INTRAVENOUS | Status: AC
  Administered 2019-01-02: 4 mg via INTRAVENOUS
  Filled 2019-01-02: qty 1

## 2019-01-02 MED ORDER — ONDANSETRON HCL 4 MG PO TABS: 4.0000 mg | ORAL_TABLET | Freq: Four times a day (QID) | ORAL | Status: DC | PRN

## 2019-01-02 NOTE — ED Notes (Signed)
ED TO INPATIENT HANDOFF REPORT  ED Nurse Name and Phone #: Florentina AddisonKatie [phone]  S Name/Age/Gender Nicholas ServeJames A Blevins 68 y.o. male Room/Bed: 042C/042C  Code Status   Code Status: Full Code  Home/SNF/Other Home Patient oriented to: self, place, time and situation Is this baseline? Yes   Triage Complete: Triage complete  Chief Complaint Abdominal pain  Triage Note Pt reports central abd pain since Monday. Reports 1 episode of emesis last night. Denies N/D.    Allergies Allergies  Allergen Reactions  . Lisinopril Swelling    Lips swell    Level of Care/Admitting Diagnosis ED Disposition    ED Disposition Condition Comment   Admit  [facility] [[id number]]  Level of Care: Med-Surg [16]  Covid Evaluation: N/A  Diagnosis: Pancreatitis, acute [[id number]][[id number]]  Admitting Physician: Anne ShutterAINES, ALEXANDER N [[id number]]  Attending Physician: Anne ShutterRAINES, ALEXANDER N [[id number]]  Estimated length of stay: past midnight tomorrow  Certification:: I certify this patient will need inpatient services for at least 2 midnights  PT Class (Do Not Modify): Inpatient [101]  PT Acc Code (Do Not Modify): Private [1]       B Medical/Surgery History Past Medical History:  Diagnosis Date  . Alcoholic pancreatitis   . GERD (gastroesophageal reflux disease)   . High cholesterol   . Hypertension   . Tobacco abuse    Past Surgical History:  Procedure Laterality Date  . DENTAL SURGERY       A IV Location/Drains/Wounds Patient Lines/Drains/Airways Status   Active Line/Drains/Airways    Name:   Placement date:   Placement time:   Site:   Days:   Peripheral IV 01/02/19 Right Hand   01/02/19    1138    Hand   less than 1          Intake/Output Last 24 hours  Intake/Output Summary (Last 24 hours) at 01/02/2019 1614 Last data filed at 01/02/2019 1430 Gross per 24 hour  Intake 2000 ml  Output -  Net 2000 ml    Labs/Imaging Results for orders placed or performed during the  hospital encounter of 01/02/19 (from the past 48 hour(s))  Lipase, blood     Status: Abnormal   Collection Time: 01/02/19  9:34 AM  Result Value Ref Range   Lipase 807 (H) 11 - 51 U/L    Comment: RESULTS CONFIRMED BY MANUAL DILUTION Performed at [facility], [address] [place], [place] [address]   Comprehensive metabolic panel     Status: Abnormal   Collection Time: 01/02/19  9:34 AM  Result Value Ref Range   Sodium 133 (L) 135 - 145 mmol/L   Potassium 3.4 (L) 3.5 - 5.1 mmol/L   Chloride 96 (L) 98 - 111 mmol/L   CO2 23 22 - 32 mmol/L   Glucose, Bld 100 (H) 70 - 99 mg/dL   BUN 12 8 - 23 mg/dL   Creatinine, Ser 8.460.93 0.61 - 1.24 mg/dL   Calcium 9.8 8.9 - 96.210.3 mg/dL   Total Protein 7.9 6.5 - 8.1 g/dL   Albumin 4.3 3.5 - 5.0 g/dL   AST 44 (H) 15 - 41 U/L   ALT 51 (H) 0 - 44 U/L   Alkaline Phosphatase 98 38 - 126 U/L   Total Bilirubin 0.9 0.3 - 1.2 mg/dL   GFR calc non Af Amer >60 >60 mL/min   GFR calc Af Amer >60 >60 mL/min   Anion gap 14 5 - 15    Comment: Performed  at [facility], [address], [place], [place] [address]  CBC     Status: Abnormal   Collection Time: 01/02/19  9:34 AM  Result Value Ref Range   WBC 7.0 4.0 - 10.5 K/uL   RBC 4.16 (L) 4.22 - 5.81 MIL/uL   Hemoglobin 15.0 13.0 - 17.0 g/dL   HCT 60.4 54.0 - 98.1 %   MCV 100.2 (H) 80.0 - 100.0 fL   MCH 36.1 (H) 26.0 - 34.0 pg   MCHC 36.0 30.0 - 36.0 g/dL   RDW 19.1 (H) 47.8 - 29.5 %   Platelets 226 150 - 400 K/uL   nRBC 0.4 (H) 0.0 - 0.2 %    Comment: Performed at [facility], [address], [place], [place] [address]  Lipid panel     Status: Abnormal   Collection Time: 01/02/19  9:34 AM  Result Value Ref Range   Cholesterol 153 0 - 200 mg/dL   Triglycerides 865 (H) <150 mg/dL   HDL 42 >78 mg/dL   Total CHOL/HDL Ratio 3.6 RATIO   VLDL 41 (H) 0 - 40 mg/dL   LDL Cholesterol 70 0 - 99 mg/dL    Comment:        Total Cholesterol/HDL:CHD Risk Coronary Heart Disease Risk Table                      Men   Women  1/2 Average Risk   3.4   3.3  Average Risk       5.0   4.4  2 X Average Risk   9.6   7.1  3 X Average Risk  23.4   11.0        Use the calculated Patient Ratio above and the CHD Risk Table to determine the patient's CHD Risk.        ATP III CLASSIFICATION (LDL):  <100     mg/dL   Optimal  469-629  mg/dL   Near or Above                    Optimal  130-159  mg/dL   Borderline  528-413  mg/dL   High  >244     mg/dL   Very High Performed at [facility], [address] [place], [place] [address]   Ethanol     Status: None   Collection Time: 01/02/19 11:28 AM  Result Value Ref Range   Alcohol, Ethyl (B) <10 <10 mg/dL    Comment: (NOTE) Lowest detectable limit for serum alcohol is 10 mg/dL. For medical purposes only. Performed at [facility], [address], [place], [place] [address]   Acetaminophen level     Status: Abnormal   Collection Time: 01/02/19 11:28 AM  Result Value Ref Range   Acetaminophen (Tylenol), Serum <10 (L) 10 - 30 ug/mL    Comment: (NOTE) Therapeutic concentrations vary significantly. A range of 10-30 ug/mL  may be an effective concentration for many patients. However, some  are best treated at concentrations outside of this range. Acetaminophen concentrations >150 ug/mL at 4 hours after ingestion  and >50 ug/mL at 12 hours after ingestion are often associated with  toxic reactions. Performed at [facility], [address] [place], [place] [address]   SARS Coronavirus 2     Status: None   Collection Time: 01/02/19 12:33 PM  Result Value Ref Range   SARS Coronavirus 2 NOT DETECTED NOT  DETECTED    Comment: (NOTE) SARS-CoV-2 target nucleic acids are NOT DETECTED. The SARS-CoV-2 RNA is generally detectable in upper and lower respiratory specimens during the acute phase of infection.  Negative  results do not preclude SARS-CoV-2 infection, do not rule out co-infections with other pathogens, and should not be  used as the sole basis for treatment or other patient management decisions.  Negative results must be combined with clinical observations, patient history, and epidemiological information. The expected result is Not Detected. Fact Sheet for Patients: http://www.biofiredefense.com/wp-content/uploads/2020/03/BIOFIRE-COVID -19-patients.pdf Fact Sheet for Healthcare Providers: http://www.biofiredefense.com/wp-content/uploads/2020/03/BIOFIRE-COVID -19-hcp.pdf This test is not yet approved or cleared by the [place] and  has been authorized for detection and/or diagnosis of SARS-CoV-2 by FDA under an Emergency Use Authorization (EUA).  This EUA will remain in effec t (meaning this test can be used) for the duration of  the COVID-19 declaration under Section 564(b)(1) of the Act, 21 U.S.C. section 360bbb-3(b)(1), unless the authorization is terminated or revoked sooner. Performed at [facility], [address] [place], [place] [address]   Urinalysis, Routine w reflex microscopic     Status: Abnormal   Collection Time: 01/02/19  1:52 PM  Result Value Ref Range   Color, Urine YELLOW YELLOW   APPearance CLEAR CLEAR   Specific Gravity, Urine >1.046 (H) 1.005 - 1.030   pH 6.0 5.0 - 8.0   Glucose, UA NEGATIVE NEGATIVE mg/dL   Hgb urine dipstick NEGATIVE NEGATIVE   Bilirubin Urine NEGATIVE NEGATIVE   Ketones, ur 20 (A) NEGATIVE mg/dL   Protein, ur NEGATIVE NEGATIVE mg/dL   Nitrite NEGATIVE NEGATIVE   Leukocytes,Ua NEGATIVE NEGATIVE    Comment: Performed at [facility], [address], [place], [place] [address]   US Abdomen Complete  Result Date: 01/02/2019 CLINICAL DATA:  Pancreatitis. EXAM: ABDOMEN ULTRASOUND COMPLETE COMPARISON:  CT scans of the abdomen dated 01/02/2019 and 02/24/2016 FINDINGS: Gallbladder: No gallstones or wall thickening visualized. No sonographic Murphy sign noted by sonographer. Common bile duct: Diameter: 4.2 mm, normal Liver: Multiple  benign-appearing liver cysts, previously demonstrated on CT scan. Diffuse hepatic steatosis. Portal vein is patent on color Doppler imaging with normal direction of blood flow towards the liver. IVC: Not well seen. Pancreas: The visualized portion of the body is slightly hypoechoic. Head and tail were obscured. Peripancreatic fluid seen on the prior CT scan is not appreciable on this ultrasound exam. Spleen: Size and appearance within normal limits. Right Kidney: Length: 11.4 cm. Echogenicity within normal limits. No mass or hydronephrosis visualized. Left Kidney: Length: 11.2 cm. Echogenicity within normal limits. No mass or hydronephrosis visualized. Abdominal aorta: No aneurysm visualized. Other findings: None. IMPRESSION: 1. Diffuse hepatic steatosis. 2. Benign hepatic cysts, stable. 3. Normal biliary tree. 4. Findings suggestive of slight edema in the body of the pancreas. Electronically Signed   By: Francene Boyers M.D.   On: 01/02/2019 15:24   Ct Abdomen Pelvis W Contrast  Result Date: 01/02/2019 CLINICAL DATA:  Abdominal pain, distension with nausea and vomiting for 5 days. EXAM: CT ABDOMEN AND PELVIS WITH CONTRAST TECHNIQUE: Multidetector CT imaging of the abdomen and pelvis was performed using the standard protocol following bolus administration of intravenous contrast. CONTRAST:  OMNIPAQUE IOHEXOL 300 MG/ML  SOLN COMPARISON:  02/24/2016 FINDINGS: Lower chest: The lung bases are clear of an acute process. No worrisome pulmonary lesions. No pleural effusions. The heart is normal in size. No pericardial effusion. Hepatobiliary: Diffuse and marked fatty infiltration of the liver. Stable septated  hepatic cysts. No worrisome hepatic lesions or intrahepatic biliary dilatation. The gallbladder is grossly normal. No common bile duct dilatation. Pancreas: CT findings consistent with acute pancreatitis. There is swelling/edema involving the pancreas along with peripancreatic inflammatory change. No  complicating features such as pancreatic necrosis, abscess or hemorrhage. Spleen: Normal size.  No focal lesions. Adrenals/Urinary Tract: The adrenal glands and kidneys are unremarkable. No renal, ureteral or bladder calculi or mass. Stomach/Bowel: The stomach, duodenum, small bowel and colon are unremarkable. No acute inflammatory changes, mass lesions or obstructive findings. The terminal ileum and appendix are normal. Vascular/Lymphatic: Age advanced atherosclerotic calcifications involving the aorta and iliac arteries. No aneurysm or dissection. No mesenteric or retroperitoneal mass or adenopathy. Small scattered inflamed upper abdominal lymph nodes are noted. Reproductive: The prostate gland and seminal vesicles are unremarkable. Other: Small amount of free pelvic fluid. Musculoskeletal: No significant bony findings. IMPRESSION: 1. CT findings consistent with acute uncomplicated pancreatitis. 2. No obvious gallstones or biliary dilatation. 3. Diffuse and fairly marked fatty infiltration of the liver. 4. Age advanced vascular calcifications including coronary artery calcifications. Electronically Signed   By: Rudie Meyer M.D.   On: 01/02/2019 13:08    Pending Labs Unresulted Labs (From admission, onward)    Start     Ordered   01/03/19 0500  Comprehensive metabolic panel  Tomorrow morning,   R     01/02/19 1439   01/03/19 0500  CBC  Tomorrow morning,   R     01/02/19 1439   01/02/19 1434  HIV antibody (Routine Testing)  Once,   R     01/02/19 1439          Vitals/Pain Today's Vitals   01/02/19 1330 01/02/19 1400 01/02/19 1430 01/02/19 1554  BP: (!) 147/87 (!) 149/89 133/87 139/86  Pulse: 88 87 89 95  Resp: (!) 22  Temp:      TempSrc:      SpO2: 96% 98% 97% 96%  Weight:      Height:      PainSc:        Isolation Precautions No active isolations  Medications Medications  acetaminophen (TYLENOL) tablet 650 mg (has no administration in time range)    Or  acetaminophen  (TYLENOL) suppository 650 mg (has no administration in time range)  senna-docusate (Senokot-S) tablet 1 tablet (has no administration in time range)  ondansetron (ZOFRAN) tablet 4 mg (has no administration in time range)    Or  ondansetron (ZOFRAN) injection 4 mg (has no administration in time range)  folic acid injection 1 mg (has no administration in time range)  thiamine (B-1) injection 100 mg (has no administration in time range)  HYDROmorphone (DILAUDID) injection 0.5 mg (has no administration in time range)  LORazepam (ATIVAN) tablet 1 mg (has no administration in time range)    Or  LORazepam (ATIVAN) injection 1 mg (has no administration in time range)  enoxaparin (LOVENOX) injection 40 mg (has no administration in time range)  ALPRAZolam (XANAX) tablet 0.5 mg (has no administration in time range)  0.9 % NaCl with KCl 20 mEq/ L  infusion (has no administration in time range)  nicotine (NICODERM CQ - dosed in mg/24 hours) patch 14 mg (has no administration in time range)  sodium chloride 0.9 % bolus 2,000 mL (0 mLs Intravenous Stopped 01/02/19 1430)  ondansetron (ZOFRAN) injection 4 mg (4 mg Intravenous Given 01/02/19 1145)  morphine 4 MG/ML injection 4 mg (4 mg Intravenous Given 01/02/19 1147)  iohexol (  OMNIPAQUE) 300 MG/ML solution 100 mL (100 mLs Intravenous Contrast Given 01/02/19 1253)    Mobility walks Low fall risk     R Recommendations: See Admitting Provider Note  Report given to:   Additional Notes:

## 2019-01-02 NOTE — ED Provider Notes (Signed)
[facility] EMERGENCY [facility]PARTMENT Provider Note   CSN: [id number] Arrival date & time: 01/02/19  0907    History   Chief Complaint Chief Complaint  Patient presents with   Abdominal Pain    HPI Delena ServeJames A Novick is a 68 y.o. male history of alcohol use with alcohol induced pancreatitis, hypertension, current cholesterol here presenting with epigastric pain.  Patient has been having epigastric pain for the last 3 to 4 days.  He states that he feels nauseated and the pain is crampy in nature and he has poor appetite.  Yesterday he had an episode of vomiting.  Patient states that he has been drinking vodka at baseline.  He states that he drank several extra shots yesterday to help him with the pain.  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      The history is provided by the patient.    Past Medical History:  Diagnosis Date   Alcoholic pancreatitis    GERD (gastroesophageal reflux disease)    High cholesterol    Hypertension    Tobacco abuse     Patient Active Problem List   Diagnosis Date Noted   Tobacco abuse 01/25/2017   Alcohol abuse 01/25/2017   Hypokalemia 01/25/2017   Alcoholic pancreatitis 01/25/2017   Acute alcoholic pancreatitis 01/25/2017   HTN (hypertension) 12/19/2012   High cholesterol 12/19/2012    Past Surgical History:  Procedure Laterality Date   DENTAL SURGERY          Home Medications    Prior to Admission medications   Medication Sig Start Date End Date Taking? Authorizing Provider  ALPRAZolam (XANAX) 0.25 MG tablet Take 0.25 mg by mouth 2 (two) times daily as needed for anxiety.    [provider]  aspirin EC 81 MG tablet Take 81 mg by mouth daily.    [provider]  atenolol (TENORMIN) 25 MG tablet Take 25 mg by mouth daily.    [provider]  chlordiazePOXIDE (LIBRIUM) 10 MG capsule Take 1 tablet three times a day for 2 days; then 1 tablet twice a day for 3 days;  then 1 tablet by mouth daily for 4 days and stop medication. Patient not taking: Reported on 11/12/2018 01/27/17   Vassie LollMadera, Carlos, MD  hydrochlorothiazide (HYDRODIURIL) 25 MG tablet Take 25 mg by mouth every morning. 01/14/16   [provider]  lovastatin (MEVACOR) 20 MG tablet Take 1 tablet (20 mg total) by mouth at bedtime. PATIENT NEEDS OFFICE VISIT/LABS FOR ADDITIONAL REFILLS Patient taking differently: Take 40 mg by mouth at bedtime.  02/23/13   Copland, Gwenlyn FoundJessica C, MD  Multiple Vitamins-Minerals (CENTRUM SILVER 50+MEN) TABS Take 1 tablet by mouth daily with breakfast.    [provider]  pantoprazole (PROTONIX) 40 MG tablet Take 40 mg by mouth daily. For symptoms 01/14/16   [provider]  potassium phosphate, monobasic, (K-PHOS) 500 MG tablet Take 1 tablet (500 mg total) by mouth 2 (two) times daily with a meal. Patient not taking: Reported on 11/12/2018 01/27/17   Vassie LollMadera, Carlos, MD    Family History Family History  Problem Relation Age of Onset   Stroke Mother    Hypertension Mother    Hypertension Sister    Hypertension Brother     Social History Social History   Tobacco Use   Smoking status: Current Every Day Smoker   Smokeless tobacco: Never Used  Substance Use Topics   Alcohol use: Yes   Drug use: No  Allergies   Lisinopril   Review of Systems Review of Systems  Gastrointestinal: Positive for abdominal pain, nausea and vomiting.  All other systems reviewed and are negative.    Physical Exam Updated Vital Signs BP (!) 151/87    Pulse 88    Temp 98.4 F (36.9 C) (Oral)    Resp (!) 22    Ht 5\' 8"  (1.727 m)    Wt 76.2 kg    SpO2 99%    BMI 25.54 kg/m   Physical Exam Vitals signs and nursing note reviewed.  Constitutional:      Comments: Uncomfortable, dehydrated   HENT:     Head: Normocephalic.     Mouth/Throat:     Comments: MM dry  Eyes:     Extraocular Movements: Extraocular movements intact.  Cardiovascular:      Rate and Rhythm: Normal rate and regular rhythm.  Abdominal:     General: Abdomen is flat. Bowel sounds are normal.     Palpations: Abdomen is soft.     Comments: +epigastric tenderness   Skin:    General: Skin is warm.     Capillary Refill: Capillary refill takes less than 2 seconds.  Neurological:     General: No focal deficit present.     Mental Status: He is oriented to person, place, and time.  Psychiatric:        Mood and Affect: Mood normal.        Behavior: Behavior normal.      ED Treatments / Results  Labs (all labs ordered are listed, but only abnormal results are displayed) Labs Reviewed  LIPASE, BLOOD - Abnormal; Notable for the following components:      Result Value   Lipase 807 (*)    All other components within normal limits  COMPREHENSIVE METABOLIC PANEL - Abnormal; Notable for the following components:   Sodium 133 (*)    Potassium 3.4 (*)    Chloride 96 (*)    Glucose, Bld 100 (*)    AST 44 (*)    ALT 51 (*)    All other components within normal limits  CBC - Abnormal; Notable for the following components:   RBC 4.16 (*)    MCV 100.2 (*)    MCH 36.1 (*)    RDW 15.6 (*)    nRBC 0.4 (*)    All other components within normal limits  ACETAMINOPHEN LEVEL - Abnormal; Notable for the following components:   Acetaminophen (Tylenol), Serum <10 (*)    All other components within normal limits  SARS CORONAVIRUS 2  ETHANOL  URINALYSIS, ROUTINE W REFLEX MICROSCOPIC    EKG EKG Interpretation  Date/Time:  Wednesday January 02 2019 09:25:51 EDT Ventricular Rate:  117 PR Interval:  134 QRS Duration: 92 QT Interval:  334 QTC Calculation: 465 R Axis:   69 Text Interpretation:  Sinus tachycardia Otherwise normal ECG Since last tracing rate faster Confirmed by Richardean Canal [phone]) on 01/02/2019 11:26:46 AM   Radiology Ct Abdomen Pelvis W Contrast  Result Date: 01/02/2019 CLINICAL DATA:  Abdominal pain, distension with nausea and vomiting for 5 days. EXAM: CT  ABDOMEN AND PELVIS WITH CONTRAST TECHNIQUE: Multidetector CT imaging of the abdomen and pelvis was performed using the standard protocol following bolus administration of intravenous contrast. CONTRAST:  OMNIPAQUE IOHEXOL 300 MG/ML  SOLN COMPARISON:  02/24/2016 FINDINGS: Lower chest: The lung bases are clear of an acute process. No worrisome pulmonary lesions. No pleural effusions. The heart is normal in  size. No pericardial effusion. Hepatobiliary: Diffuse and marked fatty infiltration of the liver. Stable septated hepatic cysts. No worrisome hepatic lesions or intrahepatic biliary dilatation. The gallbladder is grossly normal. No common bile duct dilatation. Pancreas: CT findings consistent with acute pancreatitis. There is swelling/edema involving the pancreas along with peripancreatic inflammatory change. No complicating features such as pancreatic necrosis, abscess or hemorrhage. Spleen: Normal size.  No focal lesions. Adrenals/Urinary Tract: The adrenal glands and kidneys are unremarkable. No renal, ureteral or bladder calculi or mass. Stomach/Bowel: The stomach, duodenum, small bowel and colon are unremarkable. No acute inflammatory changes, mass lesions or obstructive findings. The terminal ileum and appendix are normal. Vascular/Lymphatic: Age advanced atherosclerotic calcifications involving the aorta and iliac arteries. No aneurysm or dissection. No mesenteric or retroperitoneal mass or adenopathy. Small scattered inflamed upper abdominal lymph nodes are noted. Reproductive: The prostate gland and seminal vesicles are unremarkable. Other: Small amount of free pelvic fluid. Musculoskeletal: No significant bony findings. IMPRESSION: 1. CT findings consistent with acute uncomplicated pancreatitis. 2. No obvious gallstones or biliary dilatation. 3. Diffuse and fairly marked fatty infiltration of the liver. 4. Age advanced vascular calcifications including coronary artery calcifications. Electronically  Signed   By: Rudie Meyer M.D.   On: 01/02/2019 13:08    Procedures Procedures (including critical care time)  Medications Ordered in ED Medications  sodium chloride 0.9 % bolus 2,000 mL (2,000 mLs Intravenous New Bag/Given 01/02/19 1145)  ondansetron (ZOFRAN) injection 4 mg (4 mg Intravenous Given 01/02/19 1145)  morphine 4 MG/ML injection 4 mg (4 mg Intravenous Given 01/02/19 1147)  iohexol (OMNIPAQUE) 300 MG/ML solution 100 mL (100 mLs Intravenous Contrast Given 01/02/19 1253)     Initial Impression / Assessment and Plan / ED Course  I have reviewed the triage vital signs and the nursing notes.  Pertinent labs & imaging results that were available during my care of the patient were reviewed by me and considered in my medical decision making (see chart for details).       RUPERT AZZARA is a 68 y.o. male here with epigastric pain. 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 Will get labs, lipase, UA, CT ab/pel.   1:39 PM Lipase 800. CT showed uncomplicated pancreatitis likely from alcohol. Given IVF, zofran, pain meds. Will admit for pancreatitis.    Final Clinical Impressions(s) / ED Diagnoses   Final diagnoses:  None    ED Discharge Orders    None       Charlynne Pander, MD 01/02/19 1339

## 2019-01-02 NOTE — ED Triage Notes (Signed)
Pt reports central abd pain since Monday. Reports 1 episode of emesis last night. Denies N/D.

## 2019-01-02 NOTE — ED Notes (Signed)
Patient transported to Ultrasound 

## 2019-01-02 NOTE — H&P (Signed)
Date: 01/02/2019               Patient Name:  Nicholas Blevins MRN: [id number]  DOB: Apr 21, 1951 Age / Sex: 68 y.o., male   PCP: Jonita Albee, MD         Medical Service: Internal Medicine Teaching Service         Attending Physician: Dr. Sandre Kitty Elwin Mocha, MD    First Contact: Dr. Judeth Cornfield Pager: [phone]  Second Contact: Dr. Lanelle Bal Pager: [phone]       After Hours (After 5p/  First Contact Pager: [phone]  weekends / holidays): Second Contact Pager: [phone]   Chief Complaint: Abdominal pain  History of Present Illness:  [name] is a 68 yo M w/ PMH of alcohol use disorder, HTN, HLD and prior hx of alcoholic pancreatitis presenting with epigastric pain. He was in his usual state of health until Sunday (4 days ago) when he started feeling abdominal pain and nausea.  Pain is in the periumbilical area with some radiation to his RUQ and exacerbated by hiccoughs. Pain is intermittent, but has gotten progressively worse. Patient endorses feeling slightly improved until he ate [employer] on Monday when his pain returned. He had a couple of episodes of vomiting last night without hematemesis which temporarily improved the pain for about an hour or so, but the pain and nausea soon returned. He does endorse some hot flushes and chills last night.   On review of system, he denies any diarrhea, constipation. He denies any abdominal trauma. He has a history of drinking 5 shots of vodka per day. He mentions drinking 3 shots of vodka yesterday, which was his last drink. He denies any recent trauma; denies prior biliary colic or cholecystitis.  In the ED, he was found to have lipase of 807 and elevated liver enzymes. CT abd/pelvis was performed which showed evidence of acute pancreatitis. He was given IVF and pain medicines. IMTS was asked to admit for further management.  Meds: Current Meds  Medication Sig   acetaminophen (TYLENOL) 500 MG tablet Take 500 mg by mouth every 6  (six) hours as needed (for headaches).   albuterol (PROAIR HFA) 108 (90 Base) MCG/ACT inhaler Inhale 2 puffs into the lungs every 6 (six) hours as needed for wheezing or shortness of breath.   ALPRAZolam (XANAX) 0.5 MG tablet Take 0.5 mg by mouth 3 (three) times daily.   amLODipine (NORVASC) 10 MG tablet Take 10 mg by mouth daily.   aspirin EC 81 MG tablet Take 81 mg by mouth daily.   atenolol (TENORMIN) 100 MG tablet Take 100 mg by mouth daily.   cloNIDine (CATAPRES) 0.1 MG tablet Take 0.1 mg by mouth 2 (two) times daily as needed (if Systolic number is 150 or greater).   cyanocobalamin (CVS VITAMIN B12) 2000 MCG tablet Take 2,000 mcg by mouth daily.   docusate sodium (COLACE) 100 MG capsule Take 100 mg by mouth every other day.   hydrochlorothiazide (HYDRODIURIL) 25 MG tablet Take 25 mg by mouth every morning.   ibuprofen (ADVIL) 200 MG tablet Take 200-400 mg by mouth every 6 (six) hours as needed (for headaches or pain).   Lidocaine-Menthol (ICY HOT LIDOCAINE PLUS MENTHOL) 4-1 % CREA Apply 1 application topically as needed (to affected, painful sites).   meloxicam (MOBIC) 15 MG tablet Take 15 mg by mouth daily.   Multiple Vitamins-Minerals (CENTRUM SILVER 50+MEN) TABS Take 1 tablet by mouth daily with breakfast.   Nicotine (NICODERM  CQ TD) Place 1 patch onto the skin as needed (to reduce nicotine withdrawal symptoms while hospitalized).    pantoprazole (PROTONIX) 40 MG tablet Take 40 mg by mouth daily before breakfast.    rosuvastatin (CRESTOR) 10 MG tablet Take 10 mg by mouth at bedtime.   Allergies: Allergies as of 01/02/2019 - Review Complete 01/02/2019  Allergen Reaction Noted   Lisinopril Swelling 01/02/2019   Past Medical History:  Diagnosis Date   Alcoholic pancreatitis    GERD (gastroesophageal reflux disease)    High cholesterol    Hypertension    Tobacco abuse    Family History: No significant family history that he is aware of Family History    Problem Relation Age of Onset   Stroke Mother    Hypertension Mother    Hypertension Sister    Hypertension Brother    Social History:  Lives with fiance. 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 Currently smokes a pack a day and drinks 5 shots of vodka daily. Smokes marijuana but denies any other illicit substance use.  Social History   Tobacco Use   Smoking status: Current Every Day Smoker   Smokeless tobacco: Never Used   Tobacco comment: 1ppd  Substance Use Topics   Alcohol use: Yes    Comment: 5 shots of vodka per day   Drug use: No   Review of Systems: A complete ROS was negative except as per HPI.   Physical Exam: Blood pressure (!) 164/85, pulse 98, temperature 98.4 F (36.9 C), temperature source Oral, resp. rate 18, height 5\' 8"  (1.727 m), weight 76.2 kg, SpO2 100 %.  Gen: Well-developed, well nourished, NAD HEENT: Poor dentition, MMM, EOMI, PERRL, no icteric sclerae Neck: supple, ROM intact CV: RRR, S1, S2 normal, No rubs, no murmurs, no gallops Pulm: End-expiratory wheezing  Abd: Soft, BS+, Epigastric tenderness to palpation, negative Murphy's sign, no rebound tenderness, no guarding Extm: ROM intact, Peripheral pulses intact, No peripheral edema Skin: Dry, Warm, normal turgor  Neuro: AAOx3, Cranial Nerve II-XII intact, DTR intact Psych: Normal mood and affect  EKG: personally reviewed my interpretation is sinus tachycardia, normal axis, no ST changes since last EKG  CT ABD/PEL:  IMPRESSION: 1. CT findings consistent with acute uncomplicated pancreatitis. 2. No obvious gallstones or biliary dilatation. 3. Diffuse and fairly marked fatty infiltration of the liver. 4. Age advanced vascular calcifications including coronary artery calcifications.  Assessment & Plan by Problem: Active Problems:   HTN (hypertension)   Alcohol abuse   Acute pancreatitis   Pancreatitis, acute  Nicholas Blevins is a 68 yo M w/ PMH of alcohol use  disorder, HTN, HLD and prior hx of alcoholic pancreatitis presenting with abdominal pain 2/2 alcoholic pancreatitis. He had prior hx of similar presenting in 2018 requiring inpatient stay for alcoholic pancreatitis. CT abdomen/pelvis confirms pancreatitis. Most likely due to alcohol use but will do further work-up to rule out triglyceridemia and cholelithiasis. No hx of trauma. Will need inpatient admission for aggressive fluid resuscitation and pain control. He is high risk for withdrawal as he is a heavy drinker. Will need to monitor closely.  Acute pancreatitis 2/2 alcohol vs triglycerides vs cholelithiasis Patient with abdominal pain, nausea, vomiting, elevated lipase and CT revealing acute pancreatitis. Etiology is likely 2/2 alcohol in setting of his chronic use - IV fluid resuscitation: NaCl w/ Kcl 150cc/hr - Lipid panel - Keep NPO - Pain control: Dilaudid 0.5mg  q3hr PRN - Zofran for nausea - F/u RUQ ultrasound   Transaminitis 2/2  alcoholic cirrhosis vs cholethiasis vs viral hepatitis AST 44 ALT 51. About the same as about a month ago. Undetectable Tylenol level. No screening for chronic hepatitis or immunization for hep B on file - Will trend for now - Can get hepatitis panel if not resolved.  Alcohol Use Disorder Hx of drinking 5 shots of vodka daily. No prior hospitalization for withdrawal symptoms - CIWA protocol w/ Ativan - Folate, Thiamine  HTN: Admit bp 148/87 in pain. Home meds: atenolol 25mg  daily, hctz 25mg  daily - Holding oral med in setting of PO  HLD Home meds: lovastatin 20mg  daily - Holding oral med in setting of PO  Tobacco Use History 1 pack a day smoking history. He knows 'he smokes too much.' - Nicotine patch  DVT prophx: Lovenox Diet: NPO Bowel: Senokot Code: Full  Dispo: Admit patient to Inpatient with expected length of stay greater than 2 midnights.  Signed: Theotis Barrio, MD 01/02/2019, 4:58 PM  Pager: [phone]

## 2019-01-03 LAB — COMPREHENSIVE METABOLIC PANEL WITH GFR
ALT: 33 U/L (ref 0–44)
AST: 29 U/L (ref 15–41)
Albumin: 3.3 g/dL — ABNORMAL LOW (ref 3.5–5.0)
Alkaline Phosphatase: 76 U/L (ref 38–126)
Anion gap: 12 (ref 5–15)
BUN: 10 mg/dL (ref 8–23)
CO2: 20 mmol/L — ABNORMAL LOW (ref 22–32)
Calcium: 8.6 mg/dL — ABNORMAL LOW (ref 8.9–10.3)
Chloride: 102 mmol/L (ref 98–111)
Creatinine, Ser: 0.83 mg/dL (ref 0.61–1.24)
GFR calc Af Amer: >60 mL/min (ref >60)
GFR calc non Af Amer: >60 mL/min (ref >60)
Glucose, Bld: 78 mg/dL (ref 70–99)
Potassium: 3.9 mmol/L (ref 3.5–5.1)
Sodium: 134 mmol/L — ABNORMAL LOW (ref 135–145)
Total Bilirubin: 0.9 mg/dL (ref 0.3–1.2)
Total Protein: 6.1 g/dL — ABNORMAL LOW (ref 6.5–8.1)

## 2019-01-03 LAB — CBC
HCT: 36 % — ABNORMAL LOW (ref 39.0–52.0)
Hemoglobin: 12.7 g/dL — ABNORMAL LOW (ref 13.0–17.0)
MCH: 35.7 pg — ABNORMAL HIGH (ref 26.0–34.0)
MCHC: 35.3 g/dL (ref 30.0–36.0)
MCV: 101.1 fL — ABNORMAL HIGH (ref 80.0–100.0)
Platelets: 181 K/uL (ref 150–400)
RBC: 3.56 MIL/uL — ABNORMAL LOW (ref 4.22–5.81)
RDW: 15.6 % — ABNORMAL HIGH (ref 11.5–15.5)
WBC: 7.8 K/uL (ref 4.0–10.5)
nRBC: 0 % (ref 0.0–0.2)

## 2019-01-03 LAB — HIV ANTIBODY (ROUTINE TESTING W REFLEX): HIV Screen 4th Generation wRfx: NONREACTIVE

## 2019-01-03 MED ORDER — HYDROMORPHONE HCL 1 MG/ML IJ SOLN: 0.5000 mg | INTRAMUSCULAR | Status: DC | PRN

## 2019-01-03 MED ORDER — HYDROCODONE-ACETAMINOPHEN 5-325 MG PO TABS: 1.0000 | ORAL_TABLET | ORAL | Status: DC | PRN

## 2019-01-03 MED ORDER — SODIUM CHLORIDE 0.9 % IV SOLN
INTRAVENOUS | Status: AC
  Administered 2019-01-03: 13:00:00 via INTRAVENOUS

## 2019-01-03 NOTE — Progress Notes (Signed)
  Date: 01/03/2019  Patient name: Nicholas Blevins  Medical record number: [id number]  Date of birth: 02/23/1951   I have seen and evaluated this patient and I have discussed the plan of care with the house staff. Please see their note for complete details. I concur with their findings with the following additions/corrections:   Please see my separate attestation of the H&P from 01/02/2019.  Jessy Oto, M.D., Ph.D. 01/03/2019, 3:07 PM

## 2019-01-03 NOTE — Progress Notes (Signed)
   Subjective:  Nicholas Blevins is a 67 y.o. M with PMH of alcohol use disorder, hypertension and prior hx of alcoholic pancreatitis admit for pancreatitis on hospital day 1  Nicholas Blevins reports much improvement to abdominal pain. Today, he is able to palpate his abdomen without significant discomfort. He denies nausea or vomiting and endorses hunger. We made him aware that we will start him on a diet today. He provides additional history about his prior episode of acute pancreatitis, stating that drinking pepsi made his pain very severe. No significant similar symptoms today.   Objective:  Vital signs in last 24 hours: Vitals:   01/02/19 1600 01/02/19 1641 01/02/19 1917 01/03/19 0406  BP: (!) 142/87 (!) 164/85 (!) 141/74 133/84  Pulse: 96 98 87 95  Resp: (!) 25 18 17 17   Temp:  98.4 F (36.9 C) 98.7 F (37.1 C) 99.7 F (37.6 C)  TempSrc:  Oral Oral Oral  SpO2: 98% 100% 100% 98%  Weight:      Height:       Gen: Well-developed, well nourished, NAD HEENT: EOMI, PERRL, MMM Neck: supple, ROM intact, no JVD, no cervical adenopathy CV: RRR, S1, S2 normal, No rubs, no murmurs, no gallops Pulm: CTAB, No rales, no wheezes Abd: Soft, BS+, Mild epigastric tenderness to palpation Extm: ROM intact, Peripheral pulses intact, No peripheral edema Skin: Dry, Warm, normal turgor Neuro: AAOx3  Assessment/Plan:  Active Problems:   HTN (hypertension)   Alcohol abuse   Acute pancreatitis   Pancreatitis, acute  Nicholas Blevins is a 68 yo M w/ PMH of alcohol use disorder, HTN, HLD and prior hx of alcoholic pancreatitis presenting with abdominal pain 2/2 alcoholic pancreatitis. His abdominal tenderness appeared to have improved significantly and is willing to attempt diet today. Will start off on soft foods as he does not have his dentures with him. If he is able to tolerate without difficulty and his pain is well controlled, expect him to be discharged soon.  Acute alcoholic pancreatitis CT Abd/Pelvis:  Acute uncomplicated pancreatitis Abd [place]: Benign hepatic cysts, hepatic steatosis, normal biliary tree w/o cholelithiasis. Triglycerides on admission: 205 - IV fluid resuscitation: NaCl 75 cc/hr - Lipid panel - Start soft diet - Pain control: Dilaudid 0.5mg  q4hr PRN - Zofran for nausea   Transaminitis 2/2 pancreatitis AM labs: AST 29 ALT 33 Admit labs: AST 44 ALT 51. Trended down to WNL levels overnight - Resolved  Alcohol Use Disorder CIWA score of 0 overnight. Hx of drinking 5 shots of vodka daily. No prior hospitalization for withdrawal symptoms - CIWA protocol w/ Ativan - Folate, Thiamine  HTN: Admit bp 133/84. Acceptable bp at this am. Home meds: atenolol 25mg  daily, hctz 25mg  daily - C/w hold home PO meds until confirmed to tolerate diet  HLD Home meds: lovastatin 20mg  daily - C/w hold home PO meds until confirmed to tolerate diet  Tobacco Use History 1 pack a day smoking history. He knows 'he smokes too much.' - Nicotine patch  DVT prophx: Lovenox Diet: Soft Bowel: Senokot Code: Full  Dispo: Anticipated discharge in approximately 1-2 day(s).   Nicholas Barrio, MD 01/03/2019, 6:44 AM Pager: [phone]

## 2019-01-04 LAB — COMPREHENSIVE METABOLIC PANEL
ALT: 31 U/L (ref 0–44)
AST: 33 U/L (ref 15–41)
Albumin: 3.2 g/dL — ABNORMAL LOW (ref 3.5–5.0)
Alkaline Phosphatase: 70 U/L (ref 38–126)
Anion gap: 9 (ref 5–15)
BUN: 9 mg/dL (ref 8–23)
CO2: 22 mmol/L (ref 22–32)
Calcium: 8.6 mg/dL — ABNORMAL LOW (ref 8.9–10.3)
Chloride: 105 mmol/L (ref 98–111)
Creatinine, Ser: 0.71 mg/dL (ref 0.61–1.24)
GFR calc Af Amer: >60 mL/min (ref >60)
GFR calc non Af Amer: >60 mL/min (ref >60)
Glucose, Bld: 88 mg/dL (ref 70–99)
Potassium: 3.5 mmol/L (ref 3.5–5.1)
Sodium: 136 mmol/L (ref 135–145)
Total Bilirubin: 0.8 mg/dL (ref 0.3–1.2)
Total Protein: 6.5 g/dL (ref 6.5–8.1)

## 2019-01-04 LAB — CBC
HCT: 31 % — ABNORMAL LOW (ref 39.0–52.0)
Hemoglobin: 11.1 g/dL — ABNORMAL LOW (ref 13.0–17.0)
MCH: 35.9 pg — ABNORMAL HIGH (ref 26.0–34.0)
MCHC: 35.8 g/dL (ref 30.0–36.0)
MCV: 100.3 fL — ABNORMAL HIGH (ref 80.0–100.0)
Platelets: 167 10*3/uL (ref 150–400)
RBC: 3.09 MIL/uL — ABNORMAL LOW (ref 4.22–5.81)
RDW: 15.2 % (ref 11.5–15.5)
WBC: 6.4 10*3/uL (ref 4.0–10.5)
nRBC: 0 % (ref 0.0–0.2)

## 2019-01-04 MED ORDER — POTASSIUM CHLORIDE CRYS ER 20 MEQ PO TBCR
40.0000 meq | EXTENDED_RELEASE_TABLET | Freq: Once | ORAL | Status: AC
  Administered 2019-01-04: 40 meq via ORAL
  Filled 2019-01-04: qty 2

## 2019-01-04 NOTE — Progress Notes (Signed)
Delena Serve to be D/C'd  per MD order. Discussed with the patient and all questions fully answered.  VSS, Skin clean, dry and intact without evidence of skin break down, no evidence of skin tears noted.  IV catheter discontinued intact. Site without signs and symptoms of complications. Dressing and pressure applied.  An After Visit Summary was printed and given to the patient. Patient received prescription.  D/c education completed with patient/family including follow up instructions, medication list, d/c activities limitations if indicated, with other d/c instructions as indicated by MD - patient able to verbalize understanding, all questions fully answered.   Patient instructed to return to ED, call 911, or call MD for any changes in condition.   Patient to be escorted via WC, and D/C home via private auto.

## 2019-01-04 NOTE — Progress Notes (Signed)
   Subjective:  Nicholas Blevins is a 68 y.o. M with PMH of alcohol use disorder, hypertension and prior hx of alcoholic pancreatitis admit for pancreatitis on hospital day 2  He reprots that he is doing well and tolerating oral intake. He was able to tolerate dinner without difficulty. He is having frequent bowel movement because of the miralax. He is willing to decrease his alcohol intake and unsure if he has ever been sober in the past. His brother has advised him to abstain from alcohol. He lives at home with his fiance who is also willing to abstain from EtOH. He understands the benefits of alcohol cessation. His PCP is Dr. Roseanne Reno and states he will follow up with him.  Objective:  Vital signs in last 24 hours: Vitals:   01/03/19 0406 01/03/19 1411 01/03/19 2121 01/04/19 0454  BP: 133/84 (!) 141/80 (!) 145/85 132/82  Pulse: 95 97 95 86  Resp: 17 19 18 17   Temp: 99.7 F (37.6 C) 99.3 F (37.4 C) 98.9 F (37.2 C) 98.8 F (37.1 C)  TempSrc: Oral Oral Oral Oral  SpO2: 98% 97% 100% 99%  Weight:      Height:       Gen: Well-developed, well nourished, NAD HEENT: EOMI, PERRL, MMM Neck: supple, ROM intact, no JVD, no cervical adenopathy CV: RRR, S1, S2 normal, No rubs, no murmurs, no gallops Pulm: CTAB, No rales, no wheezes  Abd: Soft, BS+, NTND, No rebound, no guarding Extm: ROM intact, Peripheral pulses intact, No peripheral edema Skin: Dry, Warm, normal turgor  Assessment/Plan:  Principal Problem:   Acute pancreatitis Active Problems:   HTN (hypertension)   Alcohol abuse   Pancreatitis, acute  Nicholas Blevins is a 68 yo M w/ PMH of alcohol use disorder, HTN, HLD and prior hx of alcoholic pancreatitis presenting with abdominal pain 2/2 alcoholic pancreatitis. Able to tolerate PO and hydrate himself. Pain well controlled requiring no additional pain meds overnight. Had some difficulty with dinner but states mostly due to 'beans being cold.' Stable for discharge today.  Acute  alcoholic pancreatitis CT Abd/Pelvis: Acute uncomplicated pancreatitis Abd [place]: Benign hepatic cysts, hepatic steatosis, normal biliary tree w/o cholelithiasis. Triglycerides on admission: 205 - Discharge today.   Alcohol Use Disorder CIWA score of 0 overnight. Hx of drinking 5 shots of vodka daily. No prior hospitalization for withdrawal symptoms - CIWA protocol w/ Ativan - Folate, Thiamine  Tobacco Use History 1 pack a day smoking history. He knows 'he smokes too much.' - Nicotine patch  DVT prophx: Lovenox Diet: Soft Bowel: Senokot Code: Full  Dispo: Anticipated discharge in approximately today(s).   Theotis Barrio, MD 01/04/2019, 6:07 AM Pager: [phone]

## 2019-01-04 NOTE — Discharge Summary (Signed)
Name: Nicholas Blevins MRN: [id number] DOB: 06/09/1951 68 y.o. PCP: Jonita AlbeeGuest, Chris W, MD  Date of Admission: 01/02/2019  9:19 AM Date of Discharge: 01/04/2019 Attending Physician: Jessy Otoaines, Alexander MD, PhD  Discharge Diagnosis: 1. Alcoholic pancreatitis 2. Alcohol Use Disorder  Discharge Medications: Allergies as of 01/04/2019      Reactions   Lisinopril Swelling   Lips swell      Medication List    STOP taking these medications   acetaminophen 500 MG tablet Commonly known as:  TYLENOL   docusate sodium 100 MG capsule Commonly known as:  COLACE   ibuprofen 200 MG tablet Commonly known as:  ADVIL   meloxicam 15 MG tablet Commonly known as:  MOBIC     TAKE these medications   ALPRAZolam 0.5 MG tablet Commonly known as:  XANAX Take 0.5 mg by mouth 3 (three) times daily. Notes to patient:  Per home dose   amLODipine 10 MG tablet Commonly known as:  NORVASC Take 10 mg by mouth daily. Notes to patient:  Per home dose   aspirin EC 81 MG tablet Take 81 mg by mouth daily. Notes to patient:  Per home dose   atenolol 100 MG tablet Commonly known as:  TENORMIN Take 100 mg by mouth daily.   Centrum Silver 50+Men Tabs Take 1 tablet by mouth daily with breakfast. Notes to patient:  Per home dose   chlordiazePOXIDE 10 MG capsule Commonly known as:  LIBRIUM Take 1 tablet three times a day for 2 days; then 1 tablet twice a day for 3 days; then 1 tablet by mouth daily for 4 days and stop medication. Notes to patient:  Per home dose   cloNIDine 0.1 MG tablet Commonly known as:  CATAPRES Take 0.1 mg by mouth 2 (two) times daily as needed (if Systolic number is 150 or greater). Notes to patient:  Per home dose   CVS VITAMIN B12 2000 MCG tablet Generic drug:  cyanocobalamin Take 2,000 mcg by mouth daily. Notes to patient:  Per home dose   hydrochlorothiazide 25 MG tablet Commonly known as:  HYDRODIURIL Take 25 mg by mouth every morning. Notes to patient:  Per home dose   Icy Hot Lidocaine Plus Menthol 4-1 % Crea Generic drug:  Lidocaine-Menthol Apply 1 application topically as needed (to affected, painful sites). Notes to patient:  Per home dose   NICODERM CQ TD Place 1 patch onto the skin as needed (to reduce nicotine withdrawal symptoms while hospitalized). Notes to patient:  Per home dose   pantoprazole 40 MG tablet Commonly known as:  PROTONIX Take 40 mg by mouth daily before breakfast. Notes to patient:  Per home dose   ProAir HFA 108 (90 Base) MCG/ACT inhaler Generic drug:  albuterol Inhale 2 puffs into the lungs every 6 (six) hours as needed for wheezing or shortness of breath. Notes to patient:  Per home dose   rosuvastatin 10 MG tablet Commonly known as:  CRESTOR Take 10 mg by mouth at bedtime. Notes to patient:  Per home dose       Disposition and follow-up:   Nich[facility] Blev[facility].  At the hospital follow up visit please address:  1. Alcoholic pancreatitis: Ensure he is able to tolerate his regular diet. Also consider screening for hep C as he had mild transaminese elevation on admission.  2. Alcohol Use Disorder: Please continue to encourage alcohol cessation.  2.  Labs / imaging needed  at time of follow-up: N/A  3.  Pending labs/ test needing follow-up: N/A  Follow-up Appointments: Follow-up Information    [facility]. Call.   Contact information: [address] [place] [address] [phone]          Hospital Course by problem list: 1. Alcoholic pancreatitis: Nicholas Blevins is a 68 yo M w/ PMH of Alcohol Use Disorder, HTN, and prior hx of alcoholic pancreatitis presenting with nausea and abdominal pain. He was noted to have elevated lipase at 800. CT abd/pelvis was performed showing signs of acute pancreatitis. Lipid panel showed mild elevated triglycerides at 205 and abdominal ultrasound did not reveal any evidence of  cholelithiasis. He was made NPO, had aggressive fluid resuscitation and pain control with Dilaudid. Overnight his pain significantly improved and he was able to tolerate soft foods the next day. On hospital day 2, he was able to eat his regular diet with complete resolution of his abdominal pain and he was discharged home with recommendation to discontinue alcohol use.  2. Alcohol Use Disorder: Noted to have history of daily 5 shots of vodka drinking habit. On admission put on CIWA protocol and noted to have no withdrawal symptoms during hospital stay. Discussed about risks of excessive alcohol use and [name] agreed to pursue sobriety at discharge.   Discharge Vitals:   BP 132/82 (BP Location: Left Arm)   Pulse 86   Temp 98.8 F (37.1 C) (Oral)   Resp 17   Ht  (1.727 m)   Wt 76.2 kg   SpO2 99%   BMI 25.54 kg/m   Pertinent Labs, Studies, and Procedures:   Abdomen US FINDINGS: Gallbladder: No gallstones or wall thickening visualized. No sonographic Murphy sign noted by sonographer.  Common bile duct: Diameter: 4.2 mm, normal  Liver: Multiple benign-appearing liver cysts, previously demonstrated on CT scan. Diffuse hepatic steatosis. Portal vein is patent on color Doppler imaging with normal direction of blood flow towards the liver.  IVC: Not well seen.  Pancreas: The visualized portion of the body is slightly hypoechoic. Head and tail were obscured. Peripancreatic fluid seen on the prior CT scan is not appreciable on this ultrasound exam.  Spleen: Size and appearance within normal limits.  Right Kidney: Length: 11.4 cm. Echogenicity within normal limits. No mass or hydronephrosis visualized.  Left Kidney: Length: 11.2 cm. Echogenicity within normal limits. No mass or hydronephrosis visualized.  Abdominal aorta: No aneurysm visualized.  Other findings: None.  IMPRESSION: 1. Diffuse hepatic steatosis. 2. Benign hepatic cysts, stable. 3. Normal  biliary tree. 4. Findings suggestive of slight edema in the body of the pancreas.  CT ABD/PELVIS FINDINGS: Lower chest: The lung bases are clear of an acute process. No worrisome pulmonary lesions. No pleural effusions. The heart is normal in size. No pericardial effusion.  Hepatobiliary: Diffuse and marked fatty infiltration of the liver. Stable septated hepatic cysts. No worrisome hepatic lesions or intrahepatic biliary dilatation. The gallbladder is grossly normal. No common bile duct dilatation.  Pancreas: CT findings consistent with acute pancreatitis. There is swelling/edema involving the pancreas along with peripancreatic inflammatory change. No complicating features such as pancreatic necrosis, abscess or hemorrhage.  Spleen: Normal size.  No focal lesions.  Adrenals/Urinary Tract: The adrenal glands and kidneys are unremarkable. No renal, ureteral or bladder calculi or mass.  Stomach/Bowel: The stomach, duodenum, small bowel and colon are unremarkable. No acute inflammatory changes, mass lesions or obstructive findings. The terminal ileum and appendix are normal.  Vascular/Lymphatic: Age advanced  atherosclerotic calcifications involving the aorta and iliac arteries. No aneurysm or dissection.  No mesenteric or retroperitoneal mass or adenopathy. Small scattered inflamed upper abdominal lymph nodes are noted.  Reproductive: The prostate gland and seminal vesicles are unremarkable.  Other: Small amount of free pelvic fluid.  Musculoskeletal: No significant bony findings.  IMPRESSION: 1. CT findings consistent with acute uncomplicated pancreatitis. 2. No obvious gallstones or biliary dilatation. 3. Diffuse and fairly marked fatty infiltration of the liver. 4. Age advanced vascular calcifications including coronary artery calcifications.   CMP Latest Ref Rng & Units 01/04/2019 01/03/2019 01/02/2019  Glucose 70 - 99 mg/dL 88 78 308(M)  BUN 8 - 23 mg/dL 9 10  12   Creatinine 0.61 - 1.24 mg/dL 5.78 4.69 6.29  Sodium 135 - 145 mmol/L 136 134(L) 133(L)  Potassium 3.5 - 5.1 mmol/L 3.5 3.9 3.4(L)  Chloride 98 - 111 mmol/L 105 102 96(L)  CO2 22 - 32 mmol/L 22 20(L) 23  Calcium 8.9 - 10.3 mg/dL 5.2(W) 4.1(L) 9.8  Total Protein 6.5 - 8.1 g/dL 6.5 6.1(L) 7.9  Total Bilirubin 0.3 - 1.2 mg/dL 0.8 0.9 0.9  Alkaline Phos 38 - 126 U/L 70 76 98  AST 15 - 41 U/L 33 29 44(H)  ALT 0 - 44 U/L 31 33 51(H)   Lipase: 807  Discharge Instructions: Dear Nicholas Blevins  You came to [place] with abdominal pain. We have determined this was caused by pancreatitis. Here are our recommendations for you at discharge:  Please abstain from further alcohol use Take your home medications as prescribed Follow up with your primary care provider  Thank you for choosing [facility].  Discharge Instructions    Call MD for:  difficulty breathing, headache or visual disturbances   Complete by:  As directed    Call MD for:  persistant dizziness or light-headedness   Complete by:  As directed    Call MD for:  persistant nausea and vomiting   Complete by:  As directed    Call MD for:  severe uncontrolled pain   Complete by:  As directed    Diet - low sodium heart healthy   Complete by:  As directed    Increase activity slowly   Complete by:  As directed       Signed: Theotis Barrio, MD 01/05/2019, 6:17 AM   Pager: [phone]

## 2019-01-04 NOTE — Discharge Instructions (Signed)
Dear Nicholas Blevins  You came to [place] with abdominal pain. We have determined this was caused by pancreatitis. Here are our recommendations for you at discharge:  Please abstain from further alcohol use Take your home medications as prescribed Follow up with your primary care provider  Thank you for choosing [facility].    Acute Pancreatitis  Acute pancreatitis happens when the pancreas gets swollen. The pancreas is a large gland behind the stomach. The pancreas helps control blood sugar. It also makes enzymes that help digest food. This condition happens when the enzymes attack the pancreas and damage it. Most attacks last a couple of days and are dangerous. The lungs, heart, and kidneys may stop working. What are the causes?  Alcohol abuse.  Drug abuse.  Gallstones.  Some medicines.  Some chemicals.  Infection.  Damage caused by an accident.Irven Shelling (abdominal) surgery.  In some cases, the cause is not known. What are the signs or symptoms?  Pain in the upper belly and back.  Swelling of the belly  Feeling sick to your stomach (nausea) and throwing up (vomiting). How is this treated?  You will probably have to stay in the hospital. ? Treatment may include:  Fluid through an IV.  A tube to remove stomach contents and stop you from throwing up.  Not eating for 3-4 days.  Pain medicine.  Antibiotic medicines if you have an infection.  Surgery on the pancreas or gallbladder. Follow these instructions at home: Eating and drinking   Follow instructions from your doctor about diet.  Eat small meals often. Avoid eating big meals.  Eat foods that do not have a lot of fat in them.  Drink enough fluid to keep your pee (urine) pale yellow.  Do not drink alcohol if it caused your condition. General instructions  Take over-the-counter and prescription medicines only as told by your doctor.  Do not use cigarettes, e-cigarettes, and chewing tobacco. If you  need help quitting, ask your doctor.  Get plenty of rest.  If directed, check your blood sugar at home as told by your doctor.  Keep all follow-up visits as told by your doctor. This is important. Contact a doctor if:  You do not get better as quickly as expected.  You have new symptoms.  Your symptoms get worse.  You have lasting pain or weakness.  You continue to feel sick to your stomach.  You get better and then you have another pain attack.  You have a fever. Get help right away if:  You cannot eat or keep fluids down.  Your pain becomes very bad.  Your skin or the white part of your eyes turns yellow.  You throw up.  You feel dizzy or you pass out.  Your blood sugar is high (over 300 mg/dL). Summary  Acute pancreatitis happens when the pancreas gets swollen.  This condition is usually caused by alcohol abuse, drug abuse, or gallstones.  You will probably have to stay in the hospital for treatment. This information is not intended to replace advice given to you by your health care provider. Make sure you discuss any questions you have with your health care provider. Document Released: 01/04/2008 Document Revised: 11/21/2016 Document Reviewed: 04/21/2015 Elsevier Interactive Patient Education  2019 [employer].

## 2019-07-01 ENCOUNTER — Encounter (INDEPENDENT_AMBULATORY_CARE_PROVIDER_SITE_OTHER): Payer: Medicare Other | Admitting: Ophthalmology

## 2019-07-01 ENCOUNTER — Other Ambulatory Visit: Payer: Self-pay

## 2019-07-01 ENCOUNTER — Encounter (INDEPENDENT_AMBULATORY_CARE_PROVIDER_SITE_OTHER): Payer: Self-pay

## 2019-11-13 ENCOUNTER — Encounter (INDEPENDENT_AMBULATORY_CARE_PROVIDER_SITE_OTHER): Payer: Self-pay | Admitting: Otolaryngology

## 2019-11-13 ENCOUNTER — Ambulatory Visit (INDEPENDENT_AMBULATORY_CARE_PROVIDER_SITE_OTHER): Payer: Medicare Other | Admitting: Otolaryngology

## 2019-11-13 ENCOUNTER — Other Ambulatory Visit: Payer: Self-pay

## 2019-11-13 VITALS — Temp 97.9°F

## 2019-11-13 DIAGNOSIS — M26609 Unspecified temporomandibular joint disorder, unspecified side: Secondary | ICD-10-CM

## 2019-11-13 NOTE — Progress Notes (Signed)
HPI: Nicholas Blevins is a 69 y.o. male who presents is referred by his dentist for evaluation of popping in his right TMJ joint.  He has dentures and is popping in the right TMJ joint is worse when he wears his dentures.  He has not had lockjaw.  He has had no swelling or pain occasionally when he eats he has some discomfort..  Past Medical History:  Diagnosis Date  . Alcoholic pancreatitis   . High cholesterol   . Hypertension   . Tobacco abuse    Past Surgical History:  Procedure Laterality Date  . DENTAL SURGERY     Social History   Socioeconomic History  . Marital status: Legally Separated    Spouse name: Not on file  . Number of children: Not on file  . Years of education: Not on file  . Highest education level: Not on file  Occupational History  . Not on file  Tobacco Use  . Smoking status: Current Every Day Smoker    Packs/day: 1.00    Years: 51.00    Pack years: 51.00    Types: Cigarettes    Start date: 59  . Smokeless tobacco: Never Used  . Tobacco comment: 1ppd  Substance and Sexual Activity  . Alcohol use: Yes    Comment: 5 shots of vodka per day   12/2018   " i QUIT YESTERDAY "  . Drug use: No  . Sexual activity: Not on file  Other Topics Concern  . Not on file  Social History Narrative  . Not on file   Social Determinants of Health   Financial Resource Strain:   . Difficulty of Paying Living Expenses:   Food Insecurity:   . Worried About Programme researcher, broadcasting/film/video in the Last Year:   . Barista in the Last Year:   Transportation Needs:   . Freight forwarder (Medical):   Marland Kitchen Lack of Transportation (Non-Medical):   Physical Activity:   . Days of Exercise per Week:   . Minutes of Exercise per Session:   Stress:   . Feeling of Stress :   Social Connections:   . Frequency of Communication with Friends and Family:   . Frequency of Social Gatherings with Friends and Family:   . Attends Religious Services:   . Active Member of Clubs or  Organizations:   . Attends Banker Meetings:   Marland Kitchen Marital Status:    Family History  Problem Relation Age of Onset  . Stroke Mother   . Hypertension Mother   . Hypertension Sister   . Hypertension Brother    Allergies  Allergen Reactions  . Lisinopril Swelling    Lips swell   Prior to Admission medications   Medication Sig Start Date End Date Taking? Authorizing Provider  albuterol (PROAIR HFA) 108 (90 Base) MCG/ACT inhaler Inhale 2 puffs into the lungs every 6 (six) hours as needed for wheezing or shortness of breath.   Yes [provider]  ALPRAZolam Prudy Feeler) 0.5 MG tablet Take 0.5 mg by mouth 3 (three) times daily.   Yes [provider]  amLODipine (NORVASC) 10 MG tablet Take 10 mg by mouth daily.   Yes [provider]  aspirin EC 81 MG tablet Take 81 mg by mouth daily.   Yes [provider]  atenolol (TENORMIN) 100 MG tablet Take 100 mg by mouth daily.   Yes [provider]  chlordiazePOXIDE (LIBRIUM) 10 MG capsule Take 1 tablet three  times a day for 2 days; then 1 tablet twice a day for 3 days; then 1 tablet by mouth daily for 4 days and stop medication. 01/27/17  Yes Barton Dubois, MD  cloNIDine (CATAPRES) 0.1 MG tablet Take 0.1 mg by mouth 2 (two) times daily as needed (if Systolic number is 024 or greater).   Yes [provider]  cyanocobalamin ([employer] VITAMIN B12) 2000 MCG tablet Take 2,000 mcg by mouth daily.   Yes [provider]  hydrochlorothiazide (HYDRODIURIL) 25 MG tablet Take 25 mg by mouth every morning. 01/14/16  Yes [provider]  Lidocaine-Menthol (ICY HOT LIDOCAINE PLUS MENTHOL) 4-1 % CREA Apply 1 application topically as needed (to affected, painful sites).   Yes [provider]  Multiple Vitamins-Minerals (CENTRUM SILVER 50+MEN) TABS Take 1 tablet by mouth daily with breakfast.   Yes [provider]  Nicotine (NICODERM CQ TD) Place 1 patch onto the skin as needed (to  reduce nicotine withdrawal symptoms while hospitalized).    Yes [provider]  pantoprazole (PROTONIX) 40 MG tablet Take 40 mg by mouth daily before breakfast.  01/14/16  Yes [provider]  rosuvastatin (CRESTOR) 10 MG tablet Take 10 mg by mouth at bedtime.   Yes [provider]     Positive ROS: Otherwise negative  All other systems have been reviewed and were otherwise negative with the exception of those mentioned in the HPI and as above.  Physical Exam: Constitutional: Alert, well-appearing, no acute distress Ears: External ears without lesions or tenderness. Ear canals are clear bilaterally with intact, clear TMs.  Nasal: External nose without lesions. Clear nasal passages Oral: Lips and gums without lesions. Tongue and palate mucosa without lesions. Posterior oropharynx clear. Neck: No palpable adenopathy or masses.  No parotid lesions noted.  No neck adenopathy.  On palpation of the TMJ region he does have some slight popping in the office consistent with TMJ dysfunction. Respiratory: Breathing comfortably  Skin: No facial/neck lesions or rash noted.  Procedures  Assessment: Right TMJ dysfunction  Plan: I discussed with the patient today that I do not treat TMJ problems and would recommend seeing one of the dentist that treat TMJ problems and suggested either Dr. Hali Marry or Dr. Toy Cookey.   Radene Journey, MD   CC:

## 2020-03-19 ENCOUNTER — Encounter: Payer: Self-pay | Admitting: Gastroenterology

## 2020-05-18 ENCOUNTER — Ambulatory Visit: Payer: Medicare Other | Admitting: Gastroenterology

## 2020-05-18 ENCOUNTER — Encounter: Payer: Self-pay | Admitting: Internal Medicine

## 2020-06-30 ENCOUNTER — Ambulatory Visit: Payer: Medicare Other | Attending: Internal Medicine

## 2020-06-30 DIAGNOSIS — Z23 Encounter for immunization: Secondary | ICD-10-CM

## 2020-06-30 NOTE — Progress Notes (Signed)
   Covid-19 Vaccination Clinic  Name:  Nicholas Blevins    MRN: [id number] DOB: 1951/02/09  06/30/2020  [name] was observed post Covid-19 immunization for 15 minutes without incident. He was provided with Vaccine Information Sheet and instruction to access the V-Safe system.   [name] was instructed to call 911 with any severe reactions post vaccine: Marland Kitchen Difficulty breathing  . Swelling of face and throat  . A fast heartbeat  . A bad rash all over body  . Dizziness and weakness   Immunizations Administered    Name Date Dose VIS Date Route   Pfizer COVID-19 Vaccine 06/30/2020  4:06 PM 0.3 mL 05/20/2020 Intramuscular   Manufacturer: [employer], [employer]   Lot: [id number]   NDC: [address]-2

## 2021-06-03 IMAGING — US ULTRASOUND ABDOMEN COMPLETE
1 series · 13 of 25 positions shown · non-contrast
Comparison: CT scans of the abdomen dated 01/02/2019 and 02/24/2016

CLINICAL DATA: Pancreatitis.

EXAM:
ABDOMEN ULTRASOUND COMPLETE

[Series 1: ultrasound abdomen complete · 13 of 85 slices shown]
[im 1/85]
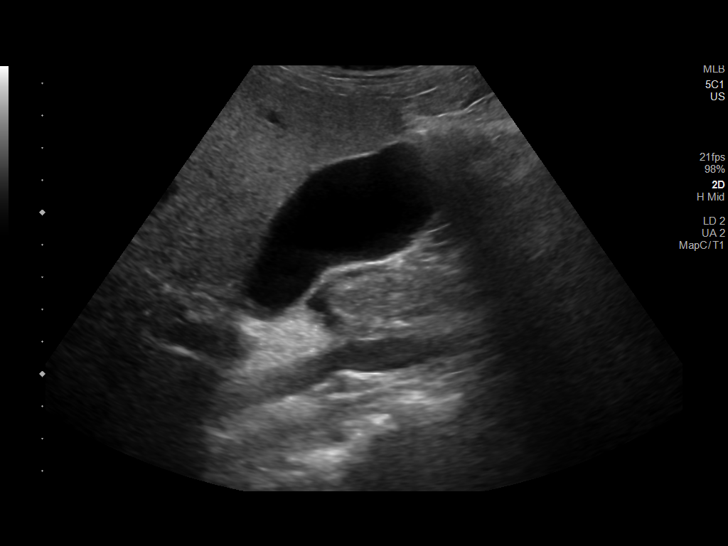
[im 8/85]
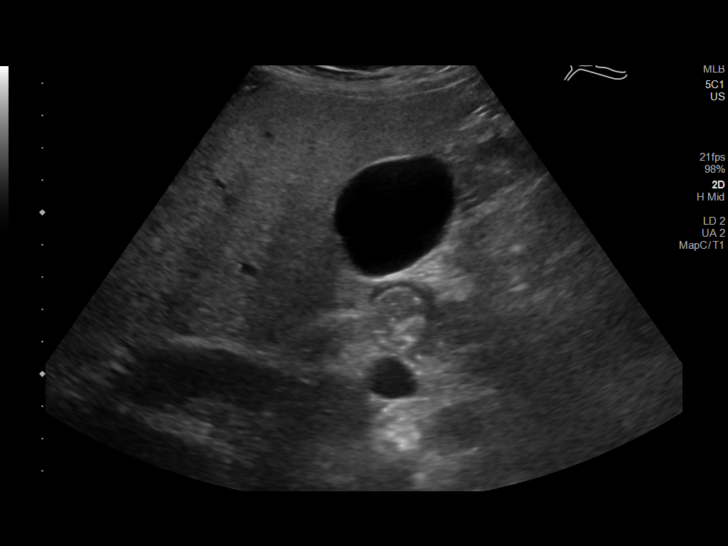
[im 15/85]
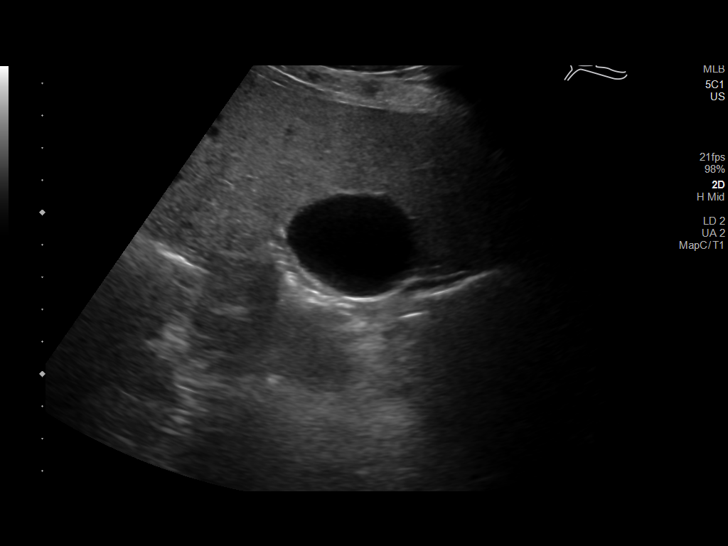
[im 22/85]
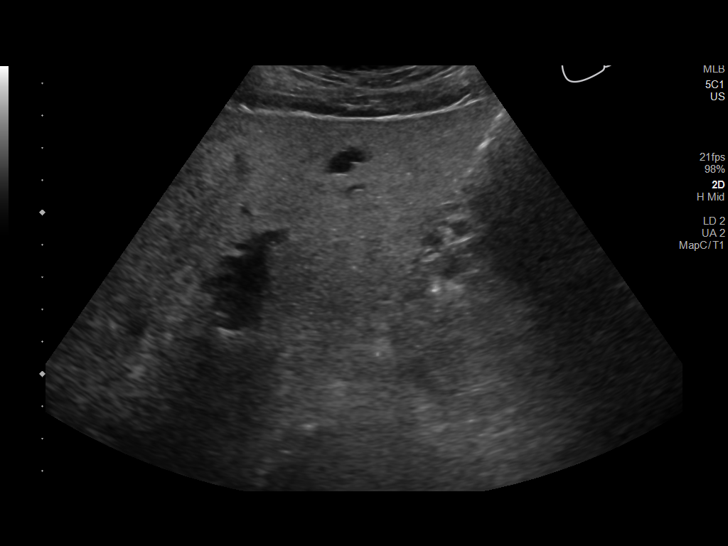
[im 29/85]
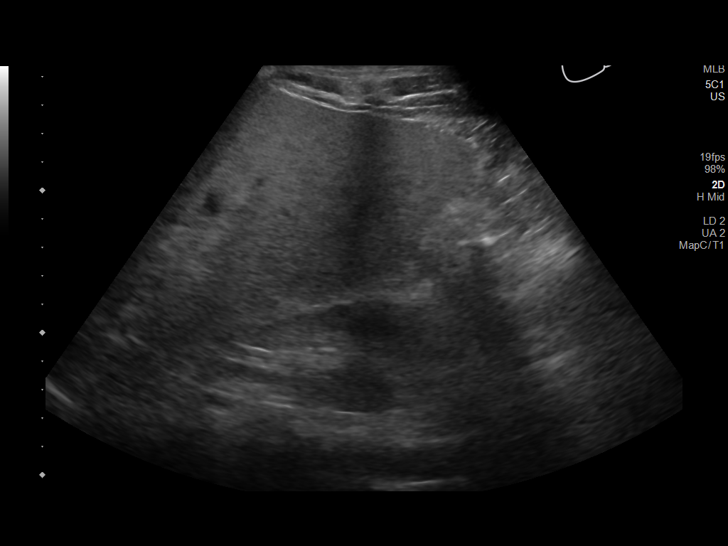
[im 36/85]
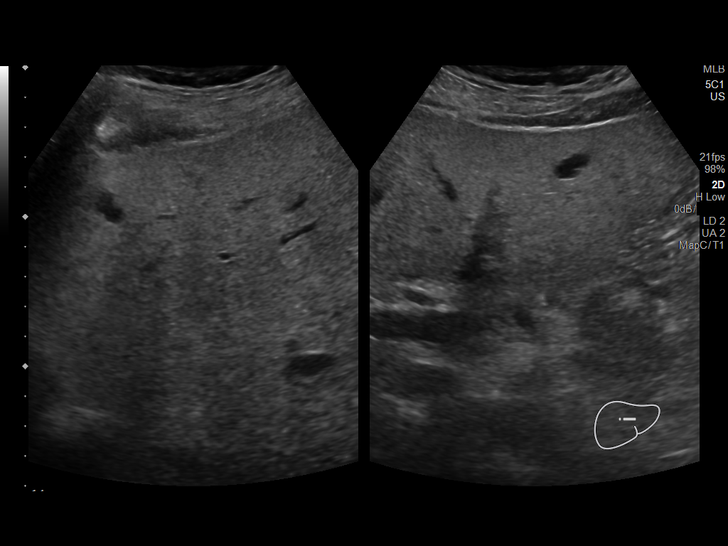
[im 43/85]
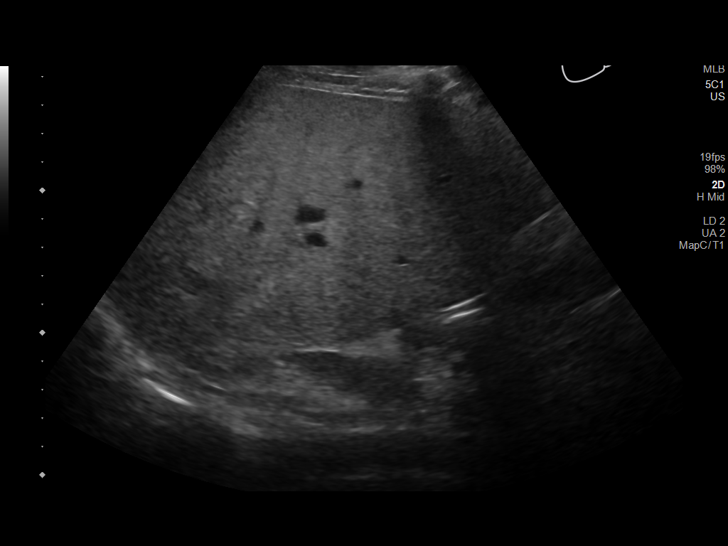
[im 50/85]
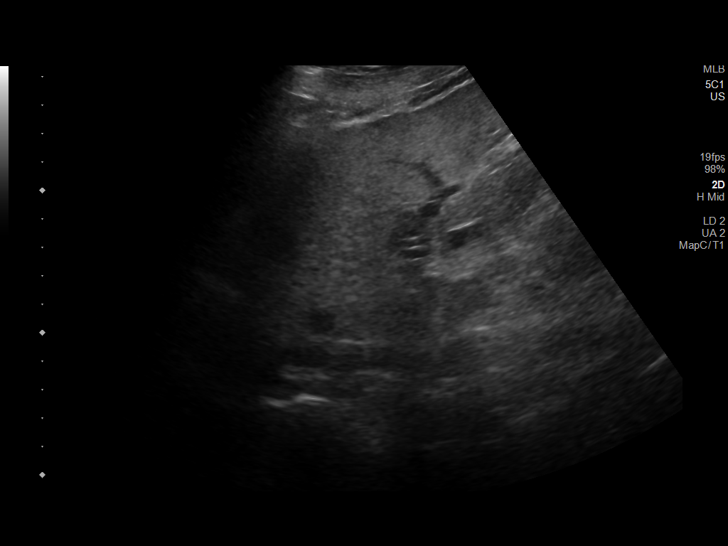
[im 57/85]
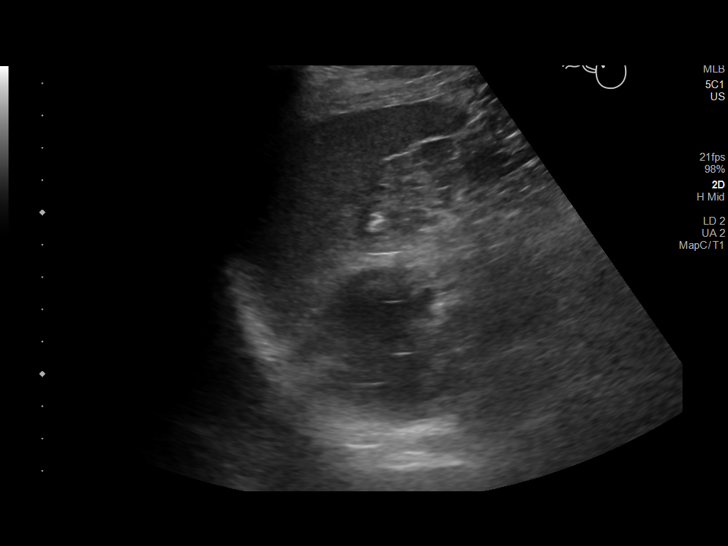
[im 64/85]
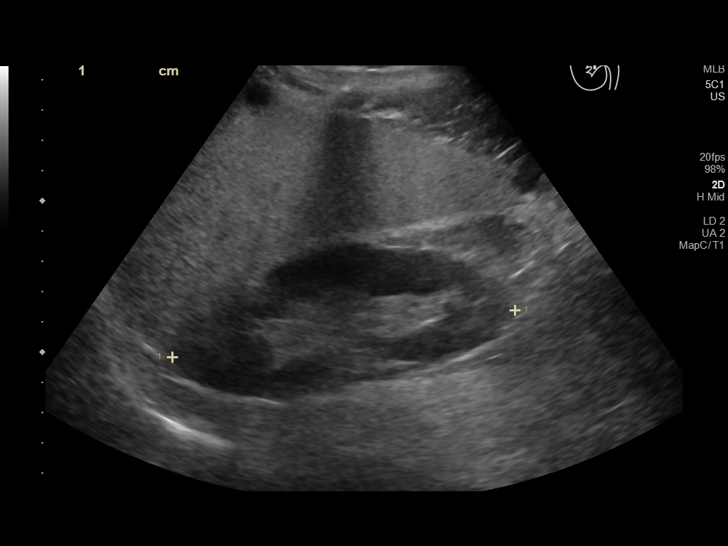
[im 71/85]
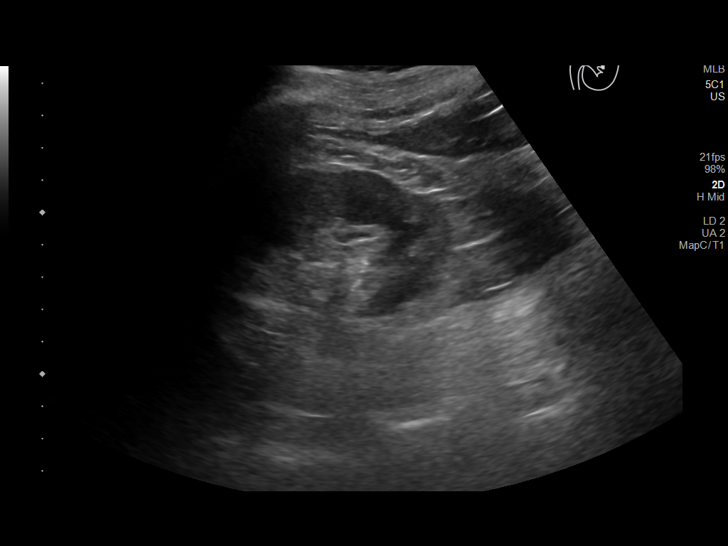
[im 78/85]
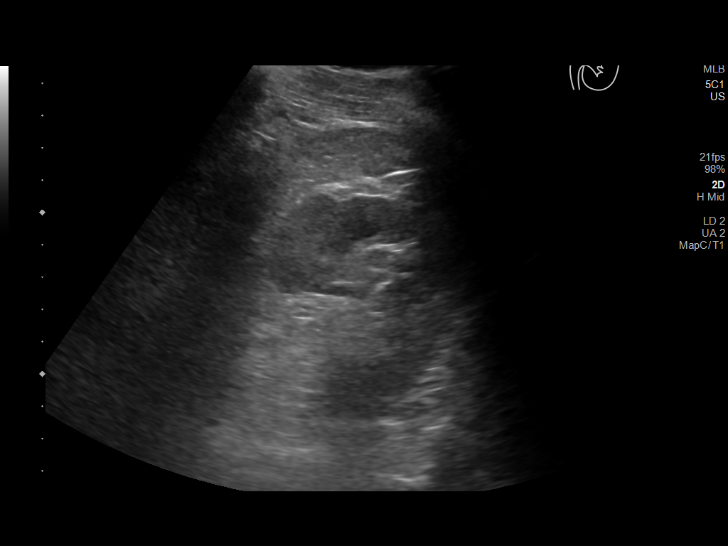
[im 85/85]
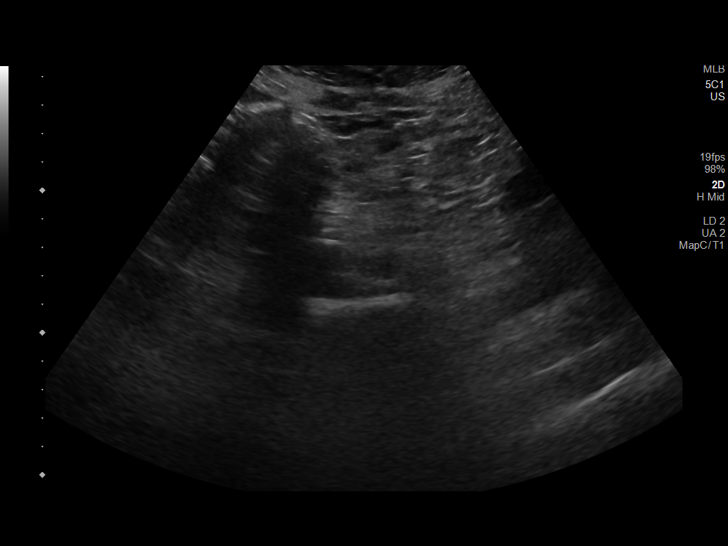

[13 of 25 positions shown; findings below may reference images not displayed]

FINDINGS: Gallbladder: No gallstones or wall thickening visualized. No
sonographic Murphy sign noted by sonographer.

Common bile duct: Diameter: 4.2 mm, normal

Liver: Multiple benign-appearing liver cysts, previously
demonstrated on CT scan. Diffuse hepatic steatosis. Portal vein is
patent on color Doppler imaging with normal direction of blood flow
towards the liver.

IVC: Not well seen.

Pancreas: The visualized portion of the body is slightly hypoechoic.
Head and tail were obscured. Peripancreatic fluid seen on the prior
CT scan is not appreciable on this ultrasound exam.

Spleen: Size and appearance within normal limits.

Right Kidney: Length: 11.4 cm. Echogenicity within normal limits. No
mass or hydronephrosis visualized.

Left Kidney: Length: 11.2 cm. Echogenicity within normal limits. No
mass or hydronephrosis visualized.

Abdominal aorta: No aneurysm visualized.

Other findings: None.
IMPRESSION: 1. Diffuse hepatic steatosis.
2. Benign hepatic cysts, stable.
3. Normal biliary tree.
4. Findings suggestive of slight edema in the body of the pancreas.

## 2021-09-01 ENCOUNTER — Encounter: Payer: Self-pay | Admitting: Pulmonary Disease

## 2021-09-01 ENCOUNTER — Ambulatory Visit (INDEPENDENT_AMBULATORY_CARE_PROVIDER_SITE_OTHER): Payer: Medicare PPO | Admitting: Pulmonary Disease

## 2021-09-01 ENCOUNTER — Other Ambulatory Visit: Payer: Self-pay

## 2021-09-01 VITALS — BP 126/74 | HR 57 | Ht 68.0 in | Wt 171.4 lb

## 2021-09-01 DIAGNOSIS — F17210 Nicotine dependence, cigarettes, uncomplicated: Secondary | ICD-10-CM | POA: Diagnosis not present

## 2021-09-01 DIAGNOSIS — R053 Chronic cough: Secondary | ICD-10-CM | POA: Diagnosis not present

## 2021-09-01 MED ORDER — BUPROPION HCL ER (SR) 150 MG PO TB12: 150.0000 mg | ORAL_TABLET | Freq: Two times a day (BID) | ORAL | 3 refills | Status: AC

## 2021-09-01 MED ORDER — SPIRIVA RESPIMAT 2.5 MCG/ACT IN AERS: 2.0000 | INHALATION_SPRAY | Freq: Every day | RESPIRATORY_TRACT | 0 refills | Status: AC

## 2021-09-01 NOTE — Progress Notes (Signed)
Synopsis: Referred in February 2023 for cough by Lou Miner, MD  Subjective:   PATIENT ID: Nicholas Blevins GENDER: male DOB: [birth year], MRN: [id number]   HPI  Chief Complaint  Patient presents with   Consult    Self referral for cough. Productive cough with clear phlegm. Happens after he smokes. Increased SOB with exertion.    Nicholas Blevins is a 71 year old male, daily smoker with hypertension and GERD who is referred to pulmonary clinic for cough.   He reports having cough over the past 2 years. He is coughing up white phlegm that is greatest in the mornings. He reports some wheezing at night when sleeping. He does snore but denies any concern for apneas. He also complains of nasal congestion and drainage in the mornings. He denies seasonal allergies.   He smokes 1 pack per day and has been smoking since the age of 30. He lives with his wife who also smokes. They are interested in working towards quitting. He is retired and worked for the [employer] He denies significant dust or chemical exposures. 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  Past Medical History:  Diagnosis Date   Alcoholic pancreatitis    Anemia    High cholesterol    Hypertension    Tobacco abuse      Family History  Problem Relation Age of Onset   Stroke Mother    Hypertension Mother    Hypertension Sister    Hypertension Brother      Social History   Socioeconomic History   Marital status: Legally Separated    Spouse name: Not on file   Number of children: Not on file   Years of education: Not on file   Highest education level: Not on file  Occupational History   Not on file  Tobacco Use   Smoking status: Every Day    Packs/day: 1.00    Years: 51.00    Pack years: 51.00    Types: Cigarettes    Start date: 1970   Smokeless tobacco: Never   Tobacco comments:    1ppd  Vaping Use   Vaping Use: Never used  Substance and Sexual Activity   Alcohol use: Yes    Comment: 5 shots of  vodka per day   12/2018   " i QUIT YESTERDAY "   Drug use: No   Sexual activity: Not on file  Other Topics Concern   Not on file  Social History Narrative   Not on file   Social Determinants of Health   Financial Resource Strain: Not on file  Food Insecurity: Not on file  Transportation Needs: Not on file  Physical Activity: Not on file  Stress: Not on file  Social Connections: Not on file  Intimate Partner Violence: Not on file     Allergies  Allergen Reactions   Lisinopril Swelling    Lips swell     Outpatient Medications Prior to Visit  Medication Sig Dispense Refill   albuterol (VENTOLIN HFA) 108 (90 Base) MCG/ACT inhaler Inhale 2 puffs into the lungs every 6 (six) hours as needed for wheezing or shortness of breath.     ALPRAZolam (XANAX) 0.5 MG tablet Take 0.5 mg by mouth 3 (three) times daily.     amLODipine (NORVASC) 10 MG tablet Take 10 mg by mouth daily.     aspirin EC 81 MG tablet Take 81 mg by mouth daily.     atenolol (TENORMIN) 100 MG  tablet Take 100 mg by mouth daily.     chlordiazePOXIDE (LIBRIUM) 10 MG capsule Take 1 tablet three times a day for 2 days; then 1 tablet twice a day for 3 days; then 1 tablet by mouth daily for 4 days and stop medication. 16 capsule 0   cloNIDine (CATAPRES) 0.1 MG tablet Take 0.1 mg by mouth 2 (two) times daily as needed (if Systolic number is Q000111Q or greater).     cyanocobalamin 2000 MCG tablet Take 2,000 mcg by mouth daily.     gabapentin (NEURONTIN) 100 MG capsule Take 100 mg by mouth at bedtime.     hydrochlorothiazide (HYDRODIURIL) 25 MG tablet Take 25 mg by mouth every morning.     Lidocaine-Menthol 4-1 % CREA Apply 1 application topically as needed (to affected, painful sites).     meloxicam (MOBIC) 15 MG tablet Take 15 mg by mouth daily as needed.     Multiple Vitamins-Minerals (CENTRUM SILVER 50+MEN) TABS Take 1 tablet by mouth daily with breakfast.     Nicotine (NICODERM CQ TD) Place 1 patch onto the skin as needed (to  reduce nicotine withdrawal symptoms while hospitalized).      pantoprazole (PROTONIX) 40 MG tablet Take 40 mg by mouth daily before breakfast.      rosuvastatin (CRESTOR) 10 MG tablet Take 10 mg by mouth at bedtime.     No facility-administered medications prior to visit.   Review of Systems  Constitutional:  Negative for chills, fever, malaise/fatigue and weight loss.  HENT:  Negative for congestion, sinus pain and sore throat.   Eyes: Negative.   Respiratory:  Positive for cough, sputum production and wheezing. Negative for hemoptysis and shortness of breath.   Cardiovascular:  Negative for chest pain, palpitations, orthopnea, claudication and leg swelling.  Gastrointestinal:  Negative for abdominal pain, heartburn, nausea and vomiting.  Genitourinary: Negative.   Musculoskeletal:  Negative for joint pain and myalgias.  Skin:  Negative for rash.  Neurological:  Negative for weakness.  Endo/Heme/Allergies: Negative.   Psychiatric/Behavioral: Negative.     Objective:   Vitals:   09/01/21 0859  BP: 126/74  Pulse: (!) 57  SpO2: 98%  Weight: 171 lb 6.4 oz (77.7 kg)  Height: 5\' 8"  (1.727 m)    Physical Exam Constitutional:      General: He is not in acute distress. HENT:     Head: Normocephalic and atraumatic.  Eyes:     Extraocular Movements: Extraocular movements intact.     Conjunctiva/sclera: Conjunctivae normal.     Pupils: Pupils are equal, round, and reactive to light.  Cardiovascular:     Rate and Rhythm: Normal rate and regular rhythm.     Pulses: Normal pulses.     Heart sounds: Normal heart sounds. No murmur heard. Pulmonary:     Effort: Pulmonary effort is normal.     Breath sounds: Normal breath sounds. No wheezing, rhonchi or rales.  Abdominal:     General: Bowel sounds are normal.     Palpations: Abdomen is soft.  Musculoskeletal:     Right lower leg: No edema.     Left lower leg: No edema.  Lymphadenopathy:     Cervical: No cervical adenopathy.   Skin:    General: Skin is warm and dry.  Neurological:     General: No focal deficit present.     Mental Status: He is alert.  Psychiatric:        Mood and Affect: Mood normal.  Behavior: Behavior normal.        Thought Content: Thought content normal.        Judgment: Judgment normal.    CBC    Component Value Date/Time   WBC 6.4 01/04/2019 0337   RBC 3.09 (L) 01/04/2019 0337   HGB 11.1 (L) 01/04/2019 0337   HCT 31.0 (L) 01/04/2019 0337   PLT 167 01/04/2019 0337   MCV 100.3 (H) 01/04/2019 0337   MCH 35.9 (H) 01/04/2019 0337   MCHC 35.8 01/04/2019 0337   RDW 15.2 01/04/2019 0337   LYMPHSABS 1.9 11/12/2018 1926   MONOABS 0.9 11/12/2018 1926   EOSABS 0.2 11/12/2018 1926   BASOSABS 0.0 11/12/2018 1926   BMP Latest Ref Rng & Units 01/04/2019 01/03/2019 01/02/2019  Glucose 70 - 99 mg/dL 88 78 100(H)  BUN 8 - 23 mg/dL 9 10 12   Creatinine 0.61 - 1.24 mg/dL 0.71 0.83 0.93  Sodium 135 - 145 mmol/L 136 134(L) 133(L)  Potassium 3.5 - 5.1 mmol/L 3.5 3.9 3.4(L)  Chloride 98 - 111 mmol/L 105 102 96(L)  CO2 22 - 32 mmol/L 22 20(L) 23  Calcium 8.9 - 10.3 mg/dL 8.6(L) 8.6(L) 9.8   Chest imaging: CXR 11/12/18 Cardiac shadow is within normal limits. The lungs are well aerated bilaterally. No focal infiltrate or effusion is seen. No bony abnormality is noted.  PFT: No flowsheet data found.  Labs:  Path:  Echo:  Heart Catheterization:  Assessment & Plan:   Chronic cough - Plan: Pulmonary Function Test  Cigarette smoker - Plan: Ambulatory Referral for Lung Cancer Scre, buPROPion (WELLBUTRIN SR) 150 MG 12 hr tablet  Discussion: Favio Brezina is a 71 year old male, daily smoker with hypertension and GERD who is referred to pulmonary clinic for cough.   There is high concern for underlying obstructive lung disease leading to his cough along with post-nasal drainage.   He is to start spiriva inhaler sample, 2.29mcg 2 puffs daily. If he notices benefit then he is to call our  office for a prescription.   We discussed smoking cessation and he is interested in starting wellbutrin along with nicotine replacement therapy. He is to start wellbutrin 150mg  daily for 7 days then to increase dose to 150mg  twice daily. He is to use 21mg  nicotine patches daily and as needed mini nicotine lozenges.   Follow up in 3 months with PFTs.   Freda Jackson, MD [facility] Office: [phone]   Current Outpatient Medications:    albuterol (VENTOLIN HFA) 108 (90 Base) MCG/ACT inhaler, Inhale 2 puffs into the lungs every 6 (six) hours as needed for wheezing or shortness of breath., Disp: , Rfl:    ALPRAZolam (XANAX) 0.5 MG tablet, Take 0.5 mg by mouth 3 (three) times daily., Disp: , Rfl:    amLODipine (NORVASC) 10 MG tablet, Take 10 mg by mouth daily., Disp: , Rfl:    aspirin EC 81 MG tablet, Take 81 mg by mouth daily., Disp: , Rfl:    atenolol (TENORMIN) 100 MG tablet, Take 100 mg by mouth daily., Disp: , Rfl:    buPROPion (WELLBUTRIN SR) 150 MG 12 hr tablet, Take 1 tablet (150 mg total) by mouth 2 (two) times daily., Disp: 60 tablet, Rfl: 3   chlordiazePOXIDE (LIBRIUM) 10 MG capsule, Take 1 tablet three times a day for 2 days; then 1 tablet twice a day for 3 days; then 1 tablet by mouth daily for 4 days and stop medication., Disp: 16 capsule, Rfl: 0  cloNIDine (CATAPRES) 0.1 MG tablet, Take 0.1 mg by mouth 2 (two) times daily as needed (if Systolic number is Q000111Q or greater)., Disp: , Rfl:    cyanocobalamin 2000 MCG tablet, Take 2,000 mcg by mouth daily., Disp: , Rfl:    gabapentin (NEURONTIN) 100 MG capsule, Take 100 mg by mouth at bedtime., Disp: , Rfl:    hydrochlorothiazide (HYDRODIURIL) 25 MG tablet, Take 25 mg by mouth every morning., Disp: , Rfl:    Lidocaine-Menthol 4-1 % CREA, Apply 1 application topically as needed (to affected, painful sites)., Disp: , Rfl:    meloxicam (MOBIC) 15 MG tablet, Take 15 mg by mouth daily as needed., Disp: , Rfl:     Multiple Vitamins-Minerals (CENTRUM SILVER 50+MEN) TABS, Take 1 tablet by mouth daily with breakfast., Disp: , Rfl:    Nicotine (NICODERM CQ TD), Place 1 patch onto the skin as needed (to reduce nicotine withdrawal symptoms while hospitalized). , Disp: , Rfl:    pantoprazole (PROTONIX) 40 MG tablet, Take 40 mg by mouth daily before breakfast. , Disp: , Rfl:    rosuvastatin (CRESTOR) 10 MG tablet, Take 10 mg by mouth at bedtime., Disp: , Rfl:

## 2021-09-01 NOTE — Patient Instructions (Addendum)
Start spiriva inhaler sample, 2 puffs daily and monitor for improvement in your cough and wheezing. If you do notice improvement, let us know and we will send in prescription.   Start wellbutrin 1 tablet daily for 7 days. If no issues, then increase to 1 tablet twice daily.   Recommend starting with 21mg  nicotine patches. Step down dosing when you feel ready.   Use mini nicotine lozenges as needed for break through cravings while on the patch.   Plan to stop using cigarettes 2 weeks after starting wellbutrin and nicotine patches  We will refer you to our lung cancer screening program for annual CT Chest scans.   Follow up in 3 months with pulmonary function tests.

## 2021-09-01 NOTE — Progress Notes (Signed)
Patient seen in the office today and instructed on use of Spiriva 2.35mcg.  Patient expressed understanding and demonstrated technique.  Benetta Spar [facility] 09/01/2021

## 2021-10-29 ENCOUNTER — Other Ambulatory Visit: Payer: Self-pay | Admitting: Orthopedic Surgery

## 2021-10-29 DIAGNOSIS — G8929 Other chronic pain: Secondary | ICD-10-CM

## 2022-07-30 ENCOUNTER — Emergency Department (HOSPITAL_BASED_OUTPATIENT_CLINIC_OR_DEPARTMENT_OTHER): Payer: Medicare HMO | Admitting: Radiology

## 2022-07-30 ENCOUNTER — Emergency Department (HOSPITAL_BASED_OUTPATIENT_CLINIC_OR_DEPARTMENT_OTHER)
Admission: EM | Admit: 2022-07-30 | Discharge: 2022-07-30 | Disposition: A | Discharge status: Home / Self Care | Payer: Medicare HMO | Attending: Emergency Medicine | Admitting: Emergency Medicine

## 2022-07-30 ENCOUNTER — Encounter (HOSPITAL_BASED_OUTPATIENT_CLINIC_OR_DEPARTMENT_OTHER): Payer: Self-pay | Admitting: Emergency Medicine

## 2022-07-30 DIAGNOSIS — M5432 Sciatica, left side: Secondary | ICD-10-CM

## 2022-07-30 DIAGNOSIS — M5442 Lumbago with sciatica, left side: Secondary | ICD-10-CM | POA: Diagnosis not present

## 2022-07-30 DIAGNOSIS — M545 Low back pain, unspecified: Secondary | ICD-10-CM | POA: Diagnosis present

## 2022-07-30 DIAGNOSIS — Z79899 Other long term (current) drug therapy: Secondary | ICD-10-CM | POA: Insufficient documentation

## 2022-07-30 DIAGNOSIS — Z7982 Long term (current) use of aspirin: Secondary | ICD-10-CM | POA: Diagnosis not present

## 2022-07-30 MED ORDER — METHYLPREDNISOLONE 4 MG PO TBPK: ORAL_TABLET | ORAL | 0 refills | Status: AC

## 2022-07-30 MED ORDER — TRAMADOL HCL 50 MG PO TABS: ORAL_TABLET | ORAL | 0 refills | Status: AC

## 2022-07-30 NOTE — ED Provider Notes (Signed)
[facility] EMERGENCY DEPT Provider Note   CSN: [id number] Arrival date & time: 07/30/22  1346     History  Chief Complaint  Patient presents with   Back Pain   Leg Pain    Nicholas Blevins is a 71 y.o. male.  HPI Patient reports he started having problems with low back pain about 3 weeks ago.  Pain is in his lower back on the left and radiating down his leg to the level of the knee.  He denies there was any specific injury.  He saw his doctor and was treated with some prednisone and got better for a while.  He reports he just recently finished taking the medication and again the back pain is now radiating down his leg.  He reports it is bad if he is standing but if he is sitting down he is comfortable.  He has not had any weakness or numbness of the leg.  No bowel or bladder dysfunction.  No abdominal pain.  He reports he came in to get the pain and the problem taken care of definitively today.    Home Medications Prior to Admission medications   Medication Sig Start Date End Date Taking? Authorizing Provider  methylPREDNISolone (MEDROL DOSEPAK) 4 MG TBPK tablet Take per Dosepak instructions 07/30/22  Yes Sanyla Summey, Jeannie Done, MD  traMADol (ULTRAM) 50 MG tablet 1-2 tablets every 6 hours as needed for pain 07/30/22  Yes Elba Schaber, Jeannie Done, MD  albuterol (VENTOLIN HFA) 108 (90 Base) MCG/ACT inhaler Inhale 2 puffs into the lungs every 6 (six) hours as needed for wheezing or shortness of breath.    [provider]  ALPRAZolam Duanne Moron) 0.5 MG tablet Take 0.5 mg by mouth 3 (three) times daily.    [provider]  amLODipine (NORVASC) 10 MG tablet Take 10 mg by mouth daily.    [provider]  aspirin EC 81 MG tablet Take 81 mg by mouth daily.    [provider]  atenolol (TENORMIN) 100 MG tablet Take 100 mg by mouth daily.    [provider]  buPROPion (WELLBUTRIN SR) 150 MG 12 hr tablet Take 1 tablet (150 mg total) by mouth 2 (two) times  daily. 09/01/21   Freddi Starr, MD  chlordiazePOXIDE (LIBRIUM) 10 MG capsule Take 1 tablet three times a day for 2 days; then 1 tablet twice a day for 3 days; then 1 tablet by mouth daily for 4 days and stop medication. 01/27/17   Barton Dubois, MD  cloNIDine (CATAPRES) 0.1 MG tablet Take 0.1 mg by mouth 2 (two) times daily as needed (if Systolic number is [id number] or greater).    [provider]  cyanocobalamin 2000 MCG tablet Take 2,000 mcg by mouth daily.    [provider]  gabapentin (NEURONTIN) 100 MG capsule Take 100 mg by mouth at bedtime. 08/10/21   [provider]  hydrochlorothiazide (HYDRODIURIL) 25 MG tablet Take 25 mg by mouth every morning. 01/14/16   [provider]  Lidocaine-Menthol 4-1 % CREA Apply 1 application topically as needed (to affected, painful sites).    [provider]  meloxicam (MOBIC) 15 MG tablet Take 15 mg by mouth daily as needed. 08/07/21   [provider]  Multiple Vitamins-Minerals (CENTRUM SILVER 50+MEN) TABS Take 1 tablet by mouth daily with breakfast.    [provider]  Nicotine (NICODERM CQ TD) Place 1 patch onto the skin as needed (to reduce nicotine withdrawal symptoms while hospitalized).  [provider]  pantoprazole (PROTONIX) 40 MG tablet Take 40 mg by mouth daily before breakfast.  01/14/16   [provider]  rosuvastatin (CRESTOR) 10 MG tablet Take 10 mg by mouth at bedtime.    [provider]  Tiotropium Bromide Monohydrate (SPIRIVA RESPIMAT) 2.5 MCG/ACT AERS Inhale 2 puffs into the lungs daily. 09/01/21   Martina Sinner, MD      Allergies    Lisinopril    Review of Systems   Review of Systems  Physical Exam Updated Vital Signs BP 135/70 (BP Location: Right Arm)   Pulse (!) 52   Temp 98 F (36.7 C)   Resp 18   SpO2 100%  Physical Exam Constitutional:      Comments: Alert nontoxic clinically well in appearance.  Seated at the edge of the  stretcher no acute distress.  HENT:     Mouth/Throat:     Pharynx: Oropharynx is clear.  Cardiovascular:     Rate and Rhythm: Normal rate and regular rhythm.  Pulmonary:     Comments: No respiratory distress.  Occasional crackle.  Good airflow throughout. Abdominal:     General: There is no distension.     Palpations: Abdomen is soft.     Tenderness: There is no abdominal tenderness. There is no guarding.  Musculoskeletal:     Comments: No rash or soft tissue changes of the back.  No significant bony point tenderness of the back.  No peripheral edema.  Calves are soft and nontender.  Patient can transition from sitting to standing.  He does have pain with transitioning.  He can also transition from sitting to supine although it is painful in the low back.  He has intact range of motion of both lower extremities.  Can elevate lower extremities off of the bed and hold against resistance.  Psychiatric:        Mood and Affect: Mood normal.     ED Results / Procedures / Treatments   Labs (all labs ordered are listed, but only abnormal results are displayed) Labs Reviewed - No data to display  EKG None  Radiology DG Lumbar Spine Complete  Result Date: 07/30/2022 CLINICAL DATA:  Right-sided back pain. EXAM: LUMBAR SPINE - COMPLETE 4+ VIEW COMPARISON:  CT abdomen and pelvis 01/29/2019 FINDINGS: There are 5 non-rib-bearing lumbar-type vertebral bodies. Normal frontal alignment. No sagittal spondylolisthesis. Vertebral body heights are maintained. No significant disc space narrowing. Mild anterior L3-4 greater than L4-5 endplate osteophytes. L4-5 and L5-S1 facet joint hypertrophy, similar to prior CT. No pars defect is seen. 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 Electronically Signed   By: Neita Garnet M.D.   On: 07/30/2022 15:00    Procedures Procedures    Medications Ordered in ED Medications - No data to display  ED Course/ Medical  Decision Making/ A&P                           Medical Decision Making Amount and/or Complexity of Data Reviewed Radiology: ordered.   Patient presents as outlined with low back pain and sciatica.  Lumbar spine films reidentify facet joint degenerative changes previously seen on prior CT scan.  This is consistent with the patient's presentation.  He does not have weakness or motor dysfunction.  He did get improvement with treatment with steroid.  At this time we will proceed with repeat treatment with a Medrol Dosepak, I have educated  him on taking Tylenol for pain control and adding tramadol as needed.  I have provided education on management of sciatica and degenerative back conditions.  Patient is educated that following up with PCP is first appropriate step with evaluation for potential referral to spine or back specialist\physical therapy\pain management.  We have reviewed signs and symptoms for emergent return.  Patient was discharged in good condition, companion at bedside present for evaluation and planning.        Final Clinical Impression(s) / ED Diagnoses Final diagnoses:  Left sided sciatica    Rx / DC Orders ED Discharge Orders          Ordered    methylPREDNISolone (MEDROL DOSEPAK) 4 MG TBPK tablet        07/30/22 1852    traMADol (ULTRAM) 50 MG tablet        07/30/22 1852              Charlesetta Shanks, MD 07/30/22 1859

## 2022-07-30 NOTE — Discharge Instructions (Signed)
1.  Follow-up with your doctor for recheck.  Low back pain and sciatica have a number of different treatment options.  This is not something that is easily gotten rid of.  You may need referral to specialist for the back and the spine, physical therapy or pain management. 2.  Start the Medrol Dosepak as prescribed.  This is a steroid to help with inflammation and pain.  You may see improvement within 6 to 12 hours of starting treatment.  You may continue taking extra strength Tylenol every 6 hours for additional pain control, you may add 1-2 tramadol tablets with your Tylenol dose for additional pain control.  Once pain is improving, you can return to taking just your Exer strength Tylenol as needed. 3.  Return to the emergency department immediately if you are getting weakness of the leg, it is not functioning, you are having problems with your bowel or bladder function or other concerning changes.  You may have difficulty doing your usual activities due to pain but you should not have difficulty due to weakness or dysfunction.

## 2022-07-30 NOTE — ED Triage Notes (Signed)
Pt here from home with c/o low back pain radiates down the right leg hx of same

## 2022-07-30 NOTE — ED Notes (Signed)
Pt verbalized understanding of d/c instructions, meds, and followup care. Denies questions. VSS, no distress noted. Steady gait to exit with all belongings.  ?

## 2022-08-29 ENCOUNTER — Encounter: Payer: Self-pay | Admitting: *Deleted

## 2024-09-02 ENCOUNTER — Encounter: Payer: Self-pay | Admitting: Gastroenterology
